# Patient Record
Sex: Male | Born: 1970 | Race: White | Hispanic: No | Marital: Married | Smoking: Never smoker
Health system: Southern US, Community
[De-identification: ages and names within clinical notes are randomized; demographics above are authoritative.]

## PROBLEM LIST (undated history)

## (undated) DIAGNOSIS — F419 Anxiety disorder, unspecified: Secondary | ICD-10-CM

## (undated) DIAGNOSIS — T7840XA Allergy, unspecified, initial encounter: Secondary | ICD-10-CM

## (undated) DIAGNOSIS — M1990 Unspecified osteoarthritis, unspecified site: Secondary | ICD-10-CM

## (undated) DIAGNOSIS — F32A Depression, unspecified: Secondary | ICD-10-CM

## (undated) HISTORY — PX: COLONOSCOPY W/ POLYPECTOMY: SHX1380

## (undated) HISTORY — DX: Allergy, unspecified, initial encounter: T78.40XA

## (undated) HISTORY — DX: Unspecified osteoarthritis, unspecified site: M19.90

## (undated) HISTORY — PX: COLONOSCOPY: SHX174

## (undated) HISTORY — PX: POLYPECTOMY: SHX149

## (undated) HISTORY — PX: WISDOM TOOTH EXTRACTION: SHX21

## (undated) HISTORY — DX: Depression, unspecified: F32.A

## (undated) HISTORY — DX: Anxiety disorder, unspecified: F41.9

---
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
## 2003-02-13 ENCOUNTER — Emergency Department (HOSPITAL_COMMUNITY): Admission: EM | Admit: 2003-02-13 | Discharge: 2003-02-13 | Payer: Self-pay | Admitting: Emergency Medicine

## 2004-12-21 ENCOUNTER — Encounter: Admission: RE | Admit: 2004-12-21 | Discharge: 2004-12-21 | Payer: Self-pay | Admitting: Internal Medicine

## 2009-12-25 ENCOUNTER — Emergency Department (HOSPITAL_COMMUNITY): Admission: EM | Admit: 2009-12-25 | Discharge: 2009-12-26 | Payer: Self-pay | Admitting: Emergency Medicine

## 2010-05-02 LAB — DIFFERENTIAL
Basophils Absolute: 0.1 10*3/uL (ref 0.0–0.1)
Basophils Relative: 0 % (ref 0–1)
Eosinophils Absolute: 0 10*3/uL (ref 0.0–0.7)
Eosinophils Relative: 0 % (ref 0–5)
Lymphocytes Relative: 4 % — ABNORMAL LOW (ref 12–46)
Lymphs Abs: 1 10*3/uL (ref 0.7–4.0)
Monocytes Absolute: 1.4 10*3/uL — ABNORMAL HIGH (ref 0.1–1.0)
Monocytes Relative: 6 % (ref 3–12)
Neutro Abs: 20.1 10*3/uL — ABNORMAL HIGH (ref 1.7–7.7)
Neutrophils Relative %: 89 % — ABNORMAL HIGH (ref 43–77)

## 2010-05-02 LAB — URINE CULTURE
Colony Count: NO GROWTH
Culture  Setup Time: 201111071019
Culture: NO GROWTH

## 2010-05-02 LAB — URINALYSIS, ROUTINE W REFLEX MICROSCOPIC
Bilirubin Urine: NEGATIVE
Glucose, UA: NEGATIVE mg/dL
Ketones, ur: NEGATIVE mg/dL
Leukocytes, UA: NEGATIVE
Nitrite: NEGATIVE
Protein, ur: NEGATIVE mg/dL
Specific Gravity, Urine: 1.015 (ref 1.005–1.030)
Urobilinogen, UA: 0.2 mg/dL (ref 0.0–1.0)
pH: 6 (ref 5.0–8.0)

## 2010-05-02 LAB — POCT I-STAT, CHEM 8
BUN: 9 mg/dL (ref 6–23)
Calcium, Ion: 1.16 mmol/L (ref 1.12–1.32)
Chloride: 102 mEq/L (ref 96–112)
Creatinine, Ser: 1.3 mg/dL (ref 0.4–1.5)
Glucose, Bld: 128 mg/dL — ABNORMAL HIGH (ref 70–99)
HCT: 45 % (ref 39.0–52.0)
Hemoglobin: 15.3 g/dL (ref 13.0–17.0)
Potassium: 3.5 mEq/L (ref 3.5–5.1)
Sodium: 138 mEq/L (ref 135–145)
TCO2: 25 mmol/L (ref 0–100)

## 2010-05-02 LAB — URINE MICROSCOPIC-ADD ON

## 2010-05-02 LAB — CBC
HCT: 42.1 % (ref 39.0–52.0)
Hemoglobin: 14.4 g/dL (ref 13.0–17.0)
MCH: 31.3 pg (ref 26.0–34.0)
MCHC: 34.3 g/dL (ref 30.0–36.0)
MCV: 91.4 fL (ref 78.0–100.0)
Platelets: 363 10*3/uL (ref 150–400)
RBC: 4.61 MIL/uL (ref 4.22–5.81)
RDW: 13.3 % (ref 11.5–15.5)
WBC: 22.5 10*3/uL — ABNORMAL HIGH (ref 4.0–10.5)

## 2010-05-02 LAB — HEMOCCULT GUIAC POC 1CARD (OFFICE): Fecal Occult Bld: NEGATIVE

## 2011-03-06 ENCOUNTER — Encounter: Payer: Self-pay | Admitting: Gastroenterology

## 2011-03-30 ENCOUNTER — Ambulatory Visit (AMBULATORY_SURGERY_CENTER): Payer: 59 | Admitting: *Deleted

## 2011-03-30 VITALS — Ht 74.0 in | Wt 220.0 lb

## 2011-03-30 DIAGNOSIS — Z1211 Encounter for screening for malignant neoplasm of colon: Secondary | ICD-10-CM

## 2011-03-30 DIAGNOSIS — Z800 Family history of malignant neoplasm of digestive organs: Secondary | ICD-10-CM

## 2011-03-30 MED ORDER — PEG-KCL-NACL-NASULF-NA ASC-C 100 G PO SOLR: ORAL | Status: DC

## 2011-04-02 ENCOUNTER — Encounter: Payer: Self-pay | Admitting: Internal Medicine

## 2011-04-11 ENCOUNTER — Encounter: Payer: 59 | Admitting: Internal Medicine

## 2011-04-13 ENCOUNTER — Other Ambulatory Visit: Payer: Self-pay | Admitting: Gastroenterology

## 2011-04-16 ENCOUNTER — Encounter: Payer: 59 | Admitting: Internal Medicine

## 2011-09-05 ENCOUNTER — Ambulatory Visit (INDEPENDENT_AMBULATORY_CARE_PROVIDER_SITE_OTHER): Payer: BC Managed Care – PPO | Admitting: Family Medicine

## 2011-09-05 VITALS — BP 130/80 | HR 71 | Temp 98.9°F | Resp 16 | Ht 74.0 in | Wt 215.0 lb

## 2011-09-05 DIAGNOSIS — S139XXA Sprain of joints and ligaments of unspecified parts of neck, initial encounter: Secondary | ICD-10-CM

## 2011-09-05 MED ORDER — KETOROLAC TROMETHAMINE 60 MG/2ML IM SOLN
60.0000 mg | Freq: Once | INTRAMUSCULAR | Status: AC
  Administered 2011-09-05: 60 mg via INTRAMUSCULAR

## 2011-09-05 MED ORDER — CYCLOBENZAPRINE HCL 5 MG PO TABS: 5.0000 mg | ORAL_TABLET | Freq: Three times a day (TID) | ORAL | Status: AC | PRN

## 2011-09-05 NOTE — Progress Notes (Signed)
  Subjective:    Patient ID: Jesus Howe, male    DOB: 1970/08/14, 41 y.o.   MRN: [id number]  HPI Neck pain x 2 days.  Initially noticed pain yesterday morning. Was fairly mild.  Pain acutely worsened since this am.  No strenuous activity.  Pain predominantly on R side of neck.  Pain radiates down neck to shoulder.  No radicular sxs.  No distal arm weakness or numbness.  Pain worse with neck flexion and extension.     Review of Systems See HPI, otherwise ROS negative.     Objective:   Physical Exam  Neck:     Gen: up in chair, NAD HEENT: NCAT, EOMI, TMs clear bilaterally CV: RRR, no murmurs auscultated PULM: CTAB, no wheezes, rales, rhoncii ABD: S/NT/+ bowel sounds  EXT: 2+ peripheral pulses    Assessment & Plan:  Cervical strain: RICE and NSAIDs.  Toradol 60mg  IM x 1.  Flexeril for pain at night.  Handout given.  Follow up if pain not improved.     The patient and/or caregiver has been counseled thoroughly with regard to treatment plan and/or medications prescribed including dosage, schedule, interactions, rationale for use, and possible side effects and they verbalize understanding. Diagnoses and expected course of recovery discussed and will return if not improved as expected or if the condition worsens. Patient and/or caregiver verbalized understanding.

## 2011-09-05 NOTE — Patient Instructions (Signed)

## 2012-05-20 ENCOUNTER — Encounter: Payer: Self-pay | Admitting: Gastroenterology

## 2012-05-20 ENCOUNTER — Ambulatory Visit (AMBULATORY_SURGERY_CENTER): Payer: BC Managed Care – PPO

## 2012-05-20 VITALS — Ht 74.0 in | Wt 210.6 lb

## 2012-05-20 DIAGNOSIS — Z800 Family history of malignant neoplasm of digestive organs: Secondary | ICD-10-CM

## 2012-05-20 DIAGNOSIS — Z1211 Encounter for screening for malignant neoplasm of colon: Secondary | ICD-10-CM

## 2012-05-20 MED ORDER — MOVIPREP 100 G PO SOLR: ORAL | Status: DC

## 2012-06-02 ENCOUNTER — Encounter: Payer: Self-pay | Admitting: Gastroenterology

## 2012-06-02 ENCOUNTER — Ambulatory Visit (AMBULATORY_SURGERY_CENTER): Payer: BC Managed Care – PPO | Admitting: Gastroenterology

## 2012-06-02 VITALS — BP 114/57 | HR 45 | Temp 98.3°F | Resp 13 | Ht 74.0 in | Wt 210.0 lb

## 2012-06-02 DIAGNOSIS — D126 Benign neoplasm of colon, unspecified: Secondary | ICD-10-CM

## 2012-06-02 DIAGNOSIS — Z800 Family history of malignant neoplasm of digestive organs: Secondary | ICD-10-CM

## 2012-06-02 DIAGNOSIS — Z1211 Encounter for screening for malignant neoplasm of colon: Secondary | ICD-10-CM

## 2012-06-02 MED ORDER — SODIUM CHLORIDE 0.9 % IV SOLN: 500.0000 mL | INTRAVENOUS | Status: DC

## 2012-06-02 NOTE — Patient Instructions (Addendum)
YOU HAD AN ENDOSCOPIC PROCEDURE TODAY AT THE [facility]: Refer to the procedure report that was given to you for any specific questions about what was found during the examination.  If the procedure report does not answer your questions, please call your gastroenterologist to clarify.  If you requested that your care partner not be given the details of your procedure findings, then the procedure report has been included in a sealed envelope for you to review at your convenience later.  YOU SHOULD EXPECT: Some feelings of bloating in the abdomen. Passage of more gas than usual.  Walking can help get rid of the air that was put into your GI tract during the procedure and reduce the bloating. If you had a lower endoscopy (such as a colonoscopy or flexible sigmoidoscopy) you may notice spotting of blood in your stool or on the toilet paper. If you underwent a bowel prep for your procedure, then you may not have a normal bowel movement for a few days.  DIET: Your first meal following the procedure should be a light meal and then it is ok to progress to your normal diet.  A half-sandwich or bowl of soup is an example of a good first meal.  Heavy or fried foods are harder to digest and may make you feel nauseous or bloated.  Likewise meals heavy in dairy and vegetables can cause extra gas to form and this can also increase the bloating.  Drink plenty of fluids but you should avoid alcoholic beverages for 24 hours.  ACTIVITY: Your care partner should take you home directly after the procedure.  You should plan to take it easy, moving slowly for the rest of the day.  You can resume normal activity the day after the procedure however you should NOT DRIVE or use heavy machinery for 24 hours (because of the sedation medicines used during the test).    SYMPTOMS TO REPORT IMMEDIATELY: A gastroenterologist can be reached at any hour.  During normal business hours, 8:30 AM to 5:00 PM Monday through Friday,  call [phone].  After hours and on weekends, please call the GI answering service at [phone] who will take a message and have the physician on call contact you.   Following lower endoscopy (colonoscopy or flexible sigmoidoscopy):  Excessive amounts of blood in the stool  Significant tenderness or worsening of abdominal pains  Swelling of the abdomen that is new, acute  Fever of 100F or higher    FOLLOW UP: If any biopsies were taken you will be contacted by phone or by letter within the next 1-3 weeks.  Call your gastroenterologist if you have not heard about the biopsies in 3 weeks.  Our staff will call the home number listed on your records the next business day following your procedure to check on you and address any questions or concerns that you may have at that time regarding the information given to you following your procedure. This is a courtesy call and so if there is no answer at the home number and we have not heard from you through the emergency physician on call, we will assume that you have returned to your regular daily activities without incident.  SIGNATURES/CONFIDENTIALITY: You and/or your care partner have signed paperwork which will be entered into your electronic medical record.  These signatures attest to the fact that that the information above on your After Visit Summary has been reviewed and is understood.  Full responsibility of the confidentiality   of this discharge information lies with you and/or your care-partner.     

## 2012-06-02 NOTE — Progress Notes (Signed)
Patient did not have preoperative order for IV antibiotic SSI prophylaxis. (G8918)  Patient did not experience any of the following events: a burn prior to discharge; a fall within the facility; wrong site/side/patient/procedure/implant event; or a hospital transfer or hospital admission upon discharge from the facility. (G8907)  

## 2012-06-02 NOTE — Progress Notes (Signed)
Called to room to assist during endoscopic procedure.  Patient ID and intended procedure confirmed with present staff. Received instructions for my participation in the procedure from the performing physician.  

## 2012-06-02 NOTE — Op Note (Signed)
[facility] [address] [place], [address]   COLONOSCOPY PROCEDURE REPORT  PATIENT: Jesus, Howe.  MR#: [id number] BIRTHDATE: 1970/06/24 , 42  yrs. old GENDER: Male ENDOSCOPIST: Mardella Layman, MD, Clementeen Graham REFERRED BY:  Creola Corn, M.D. PROCEDURE DATE:  06/02/2012 PROCEDURE:   Colonoscopy with snare polypectomy ASA CLASS:   Class I INDICATIONS:Average risk patient for colon cancer. MEDICATIONS: propofol (Diprivan) 300mg  IV  DESCRIPTION OF PROCEDURE:   After the risks and benefits and of the procedure were explained, informed consent was obtained.  A digital rectal exam revealed no abnormalities of the rectum.    The LB CF-[id number]  endoscope was introduced through the anus and advanced to the cecum, which was identified by both the appendix and ileocecal valve .  The quality of the prep was excellent, using MoviPrep .  The instrument was then slowly withdrawn as the colon was fully examined.     COLON FINDINGS: A sessile polyp was found.   The colon was otherwise normal.  There was no diverticulosis, inflammation, polyps or cancers unless previously stated. in the cecum and cold snare excised.    Retroflexed views revealed no abnormalities.     The scope was then withdrawn from the patient and the procedure completed.  COMPLICATIONS: There were no complications. ENDOSCOPIC IMPRESSION: 1.   Sessile polyp was found .Marland Kitchen?? serrated adenoma in a patient with ++ FH of colon cancer. 2.   The colon was otherwise normal  RECOMMENDATIONS: Repeat Colonoscopy in 3 years.   REPEAT EXAM:  cc:  _______________________________ eSignedMardella Layman, MD, [facility] 06/02/2012 9:18 AM     PATIENT NAME:  Jesus Howe MR#: [id number]

## 2012-06-03 ENCOUNTER — Telehealth: Payer: Self-pay | Admitting: *Deleted

## 2012-06-03 NOTE — Telephone Encounter (Signed)
  Follow up Call-  Call back number 06/02/2012  Post procedure Call Back phone  # [phone]  Permission to leave phone message Yes     Patient questions:  Do you have a fever, pain , or abdominal swelling? no Pain Score  0 *  Have you tolerated food without any problems? yes  Have you been able to return to your normal activities? yes  Do you have any questions about your discharge instructions: Diet   no Medications  no Follow up visit  no  Do you have questions or concerns about your Care? no  Actions: * If pain score is 4 or above: No action needed, pain <4.

## 2012-06-09 ENCOUNTER — Encounter: Payer: Self-pay | Admitting: Gastroenterology

## 2012-06-09 ENCOUNTER — Encounter: Payer: Self-pay | Admitting: *Deleted

## 2012-10-21 ENCOUNTER — Emergency Department (HOSPITAL_COMMUNITY)
Admission: EM | Admit: 2012-10-21 | Discharge: 2012-10-21 | Disposition: A | Discharge status: Home / Self Care | Payer: BC Managed Care – PPO | Attending: Pediatric Emergency Medicine | Admitting: Pediatric Emergency Medicine

## 2012-10-21 ENCOUNTER — Encounter (HOSPITAL_COMMUNITY): Payer: Self-pay | Admitting: *Deleted

## 2012-10-21 DIAGNOSIS — R6889 Other general symptoms and signs: Secondary | ICD-10-CM | POA: Insufficient documentation

## 2012-10-21 DIAGNOSIS — Z79899 Other long term (current) drug therapy: Secondary | ICD-10-CM | POA: Insufficient documentation

## 2012-10-21 DIAGNOSIS — Z23 Encounter for immunization: Secondary | ICD-10-CM | POA: Insufficient documentation

## 2012-10-21 DIAGNOSIS — Z203 Contact with and (suspected) exposure to rabies: Secondary | ICD-10-CM

## 2012-10-21 MED ORDER — RABIES VACCINE, PCEC IM SUSR
1.0000 mL | Freq: Once | INTRAMUSCULAR | Status: AC
  Administered 2012-10-21: 1 mL via INTRAMUSCULAR
  Filled 2012-10-21: qty 1

## 2012-10-21 MED ORDER — RABIES IMMUNE GLOBULIN 150 UNIT/ML IM INJ
20.0000 [IU]/kg | INJECTION | Freq: Once | INTRAMUSCULAR | Status: AC
  Administered 2012-10-21: 1950 [IU] via INTRAMUSCULAR
  Filled 2012-10-21: qty 13

## 2012-10-21 NOTE — ED Notes (Signed)
Pt. Repots possible bat exposure due to bat being in the house last night.

## 2012-10-21 NOTE — ED Provider Notes (Signed)
CSN: [id number]     Arrival date & time 10/21/12  1418 History   First MD Initiated Contact with Patient 10/21/12 1424     Chief Complaint  Patient presents with  . Broadus John    (Consider location/radiation/quality/duration/timing/severity/associated sxs/prior Treatment) HPI Comments: Bat in house last night.  No known contact with bat. No fever or other symptoms at this time.    Patient is a 42 y.o. male presenting with general illness. The history is provided by the patient.  Illness Severity:  Mild Onset quality:  Sudden Duration:  12 hours Timing:  Constant Progression:  Unchanged Chronicity:  New Associated symptoms: no fever     Past Medical History  Diagnosis Date  . Allergy    Past Surgical History  Procedure Laterality Date  . Anterior cruciate ligament repair  1989    RIGHT  . Wisdom tooth extraction     Family History  Problem Relation Age of Onset  . Colon cancer Mother 73    X2  . Breast cancer Mother    History  Substance Use Topics  . Smoking status: Never Smoker   . Smokeless tobacco: Never Used  . Alcohol Use: 4.4 oz/week    4 Glasses of wine, 4 Drinks containing 0.5 oz of alcohol per week    Review of Systems  Constitutional: Negative for fever.  All other systems reviewed and are negative.    Allergies  Review of patient's allergies indicates no known allergies.  Home Medications   Current Outpatient Rx  Name  Route  Sig  Dispense  Refill  . finasteride (PROPECIA) 1 MG tablet   Oral   Take 1 mg by mouth daily.         . Naproxen Sodium (ALEVE PO)   Oral   Take 2 tablets by mouth daily as needed (pain).          BP 116/76  Pulse 72  Temp(Src) 98.4 F (36.9 C) (Oral)  Resp 18  Wt 211 lb 8 oz (95.936 kg)  BMI 27.14 kg/m2  SpO2 97% Physical Exam  Nursing note and vitals reviewed. Constitutional: He appears well-developed and well-nourished.  HENT:  Head: Normocephalic and atraumatic.  Eyes: Conjunctivae are normal.   Neck: Neck supple.  Cardiovascular: Normal rate and regular rhythm.   Pulmonary/Chest: Effort normal.  Abdominal: Soft.  Musculoskeletal: Normal range of motion.  Neurological: He is alert.  Skin: Skin is warm and dry.    ED Course  Procedures (including critical care time) Labs Review Labs Reviewed - No data to display Imaging Review No results found.  MDM   1. Rabies exposure    42 y.o. with bat exposure last night.  Rabies vaccine and immunoglobulin   Ermalinda Memos, MD 10/21/12 [phone]

## 2012-10-24 ENCOUNTER — Encounter (HOSPITAL_COMMUNITY): Payer: Self-pay | Admitting: Emergency Medicine

## 2012-10-24 ENCOUNTER — Emergency Department (HOSPITAL_COMMUNITY)
Admission: EM | Admit: 2012-10-24 | Discharge: 2012-10-24 | Disposition: A | Discharge status: Home / Self Care | Payer: BC Managed Care – PPO | Source: Home / Self Care

## 2012-10-24 DIAGNOSIS — Z203 Contact with and (suspected) exposure to rabies: Secondary | ICD-10-CM

## 2012-10-24 MED ORDER — RABIES VACCINE, PCEC IM SUSR
INTRAMUSCULAR | Status: AC
  Filled 2012-10-24: qty 1

## 2012-10-24 MED ORDER — RABIES VACCINE, PCEC IM SUSR
1.0000 mL | Freq: Once | INTRAMUSCULAR | Status: AC
  Administered 2012-10-24: 1 mL via INTRAMUSCULAR

## 2012-10-24 NOTE — ED Notes (Signed)
Here for rabies injection only. Pt voices no concerns at this time.  

## 2012-10-29 ENCOUNTER — Emergency Department (HOSPITAL_COMMUNITY)
Admission: EM | Admit: 2012-10-29 | Discharge: 2012-10-29 | Disposition: A | Discharge status: Home / Self Care | Payer: BC Managed Care – PPO | Source: Home / Self Care

## 2012-10-29 ENCOUNTER — Encounter (HOSPITAL_COMMUNITY): Payer: Self-pay | Admitting: Emergency Medicine

## 2012-10-29 DIAGNOSIS — Z203 Contact with and (suspected) exposure to rabies: Secondary | ICD-10-CM

## 2012-10-29 MED ORDER — RABIES VACCINE, PCEC IM SUSR
1.0000 mL | Freq: Once | INTRAMUSCULAR | Status: AC
  Administered 2012-10-29: 1 mL via INTRAMUSCULAR

## 2012-10-29 MED ORDER — RABIES VACCINE, PCEC IM SUSR
INTRAMUSCULAR | Status: AC
  Filled 2012-10-29: qty 1

## 2012-10-29 NOTE — ED Notes (Signed)
Patient is here for Day 7 vaccination

## 2012-11-05 ENCOUNTER — Encounter (HOSPITAL_COMMUNITY): Payer: Self-pay | Admitting: Emergency Medicine

## 2012-11-05 ENCOUNTER — Emergency Department (HOSPITAL_COMMUNITY)
Admission: EM | Admit: 2012-11-05 | Discharge: 2012-11-05 | Disposition: A | Discharge status: Home / Self Care | Payer: BC Managed Care – PPO | Source: Home / Self Care

## 2012-11-05 DIAGNOSIS — Z203 Contact with and (suspected) exposure to rabies: Secondary | ICD-10-CM

## 2012-11-05 MED ORDER — RABIES VACCINE, PCEC IM SUSR
1.0000 mL | Freq: Once | INTRAMUSCULAR | Status: AC
  Administered 2012-11-05: 1 mL via INTRAMUSCULAR

## 2012-11-05 MED ORDER — RABIES VACCINE, PCEC IM SUSR
INTRAMUSCULAR | Status: AC
  Filled 2012-11-05: qty 1

## 2012-11-05 NOTE — ED Notes (Signed)
Pt is here for his 4th rabies vaccination Voices no new concerns... He is alert w/no signs of acute distress.

## 2014-07-21 ENCOUNTER — Ambulatory Visit (INDEPENDENT_AMBULATORY_CARE_PROVIDER_SITE_OTHER): Payer: BLUE CROSS/BLUE SHIELD

## 2014-07-21 ENCOUNTER — Ambulatory Visit (INDEPENDENT_AMBULATORY_CARE_PROVIDER_SITE_OTHER): Payer: BLUE CROSS/BLUE SHIELD | Admitting: Family Medicine

## 2014-07-21 VITALS — BP 110/74 | HR 52 | Temp 98.2°F | Resp 16 | Ht 74.0 in | Wt 208.0 lb

## 2014-07-21 DIAGNOSIS — S9001XA Contusion of right ankle, initial encounter: Secondary | ICD-10-CM

## 2014-07-21 DIAGNOSIS — S90511A Abrasion, right ankle, initial encounter: Secondary | ICD-10-CM | POA: Diagnosis not present

## 2014-07-21 DIAGNOSIS — M25571 Pain in right ankle and joints of right foot: Secondary | ICD-10-CM

## 2014-07-21 DIAGNOSIS — Z23 Encounter for immunization: Secondary | ICD-10-CM | POA: Diagnosis not present

## 2014-07-21 NOTE — Progress Notes (Signed)
[facility] [address], [place] [address] [phone]  Date:  07/21/2014   Name:  Jesus Howe   DOB:  [birth year]   MRN:  [id number]  PCP:  Precious Reel, MD    Chief Complaint: Ankle Pain   History of Present Illness:  Jesus Howe is a 44 y.o. very pleasant male patient who presents with the following:  Here today with a right ankle injury- he was working up a ladder, something fell and hit his lateral ankle.  The ankle "blew up" right away," he developed bruise and got a small laceration on the ankle.  He does not have a lot of pain with walking, but does not have rull ROM He did not twist the ankle.    He is not aware of the date of his last tetanus shot.    There are no active problems to display for this patient.   Past Medical History  Diagnosis Date  . Allergy     Past Surgical History  Procedure Laterality Date  . Anterior cruciate ligament repair  1989    RIGHT  . Wisdom tooth extraction      History  Substance Use Topics  . Smoking status: Never Smoker   . Smokeless tobacco: Never Used  . Alcohol Use: 4.4 oz/week    4 Glasses of wine, 4 Standard drinks or equivalent per week    Family History  Problem Relation Age of Onset  . Colon cancer Mother 62    X2  . Breast cancer Mother     No Known Allergies  Medication list has been reviewed and updated.  Current Outpatient Prescriptions on File Prior to Visit  Medication Sig Dispense Refill  . finasteride (PROPECIA) 1 MG tablet Take 1 mg by mouth daily.    . Naproxen Sodium (ALEVE PO) Take 2 tablets by mouth daily as needed (pain).     No current facility-administered medications on file prior to visit.    Review of Systems:  As per HPI- otherwise negative.   Physical Examination: Filed Vitals:   07/21/14 0814  BP: 110/74  Pulse: 52  Temp: 98.2 F (36.8 C)  Resp: 16   Filed Vitals:   07/21/14 0814  Height: 6\' 2"  (1.88 m)  Weight: 208 lb (94.348 kg)    Body mass index is 26.69 kg/(m^2). Ideal Body Weight: Weight in (lb) to have BMI = 25: 194.3  GEN: WDWN, NAD, Non-toxic, A & O x 3, looks well HEENT: Atraumatic, Normocephalic. Neck supple. No masses, No LAD. Ears and Nose: No external deformity. CV: RRR, No M/G/R. No JVD. No thrill. No extra heart sounds. PULM: CTA B, no wheezes, crackles, rhonchi. No retractions. No resp. distress. No accessory muscle use. EXTR: No c/c/e NEURO Normal gait. Does not favor ankle PSYCH: Normally interactive. Conversant. Not depressed or anxious appearing.  Calm demeanor.  Right ankle: small abrasion over the lateral malleolus, and bruise under the malleolus.  Achilles is normal and intact.  He is tender over the abrasion, OW non- tender  [facility] reading (PRIMARY) by  Dr. Lorelei Pont. Right ankle: negative  Assessment and Plan: Contusion of right ankle, initial encounter - Plan: DG Ankle Complete Right  Pain in right ankle  Abrasion of right ankle, initial encounter - Plan: Tdap vaccine greater than or equal to 7yo IM  See pt instructions Tetanus updated today  Signed Lamar Blinks, MD

## 2014-07-21 NOTE — Patient Instructions (Signed)
I do not see any sign of an ankle fracture- it seems you have a bad bruise.Watch for any sign of infection such as redness, swelling or heat.  Let me know if your ankle is not feeling better over the next week or so  Otherwise I think your ankle will do well- ice and elevate as needed, and you can certainly use an ace wrap if you like.    You got a tetanus booster today as well

## 2015-05-31 ENCOUNTER — Encounter: Payer: Self-pay | Admitting: Gastroenterology

## 2015-07-07 ENCOUNTER — Encounter: Payer: Self-pay | Admitting: Gastroenterology

## 2015-08-15 ENCOUNTER — Ambulatory Visit (AMBULATORY_SURGERY_CENTER): Payer: Self-pay

## 2015-08-15 VITALS — Ht 74.0 in | Wt 216.0 lb

## 2015-08-15 DIAGNOSIS — Z860100 Personal history of colon polyps, unspecified: Secondary | ICD-10-CM

## 2015-08-15 MED ORDER — SUPREP BOWEL PREP KIT 17.5-3.13-1.6 GM/177ML PO SOLN: 1.0000 | Freq: Once | ORAL | Status: DC

## 2015-08-15 NOTE — Progress Notes (Signed)
No allergies to eggs or soy No past problems with anesthesia No diet meds No home oxygen  Has email and internet; declined emmi 

## 2015-08-17 ENCOUNTER — Encounter: Payer: Self-pay | Admitting: Gastroenterology

## 2015-08-26 ENCOUNTER — Encounter: Payer: Self-pay | Admitting: Gastroenterology

## 2015-08-26 ENCOUNTER — Ambulatory Visit (AMBULATORY_SURGERY_CENTER): Payer: BLUE CROSS/BLUE SHIELD | Admitting: Gastroenterology

## 2015-08-26 VITALS — BP 105/60 | HR 54 | Temp 97.1°F | Resp 21 | Ht 74.0 in | Wt 216.0 lb

## 2015-08-26 DIAGNOSIS — Z800 Family history of malignant neoplasm of digestive organs: Secondary | ICD-10-CM

## 2015-08-26 DIAGNOSIS — Z86010 Personal history of colonic polyps: Secondary | ICD-10-CM | POA: Diagnosis not present

## 2015-08-26 MED ORDER — SODIUM CHLORIDE 0.9 % IV SOLN: 500.0000 mL | INTRAVENOUS | Status: DC

## 2015-08-26 NOTE — Patient Instructions (Signed)

## 2015-08-26 NOTE — Op Note (Signed)
Jesus Howe Procedure Date: 08/26/2015 10:43 AM MRN: [id number] Endoscopist: Mallie Mussel Jesus Howe , MD Age: 45 Referring MD:  Date of Birth: 10/08/1970 Gender: Male Account #: [id number] Procedure:                Colonoscopy Indications:              High risk colon cancer surveillance: Personal                            history of sessile serrated colon polyp (less than                            10 mm in size) with no dysplasia, Family history of                            colon cancer in a first-degree relative (mother,                            age > 9) Medicines:                Monitored Anesthesia Care Procedure:                Pre-Anesthesia Assessment:                           - Prior to the procedure, a History and Physical                            was performed, and patient medications and                            allergies were reviewed. The patient's tolerance of                            previous anesthesia was also reviewed. The risks                            and benefits of the procedure and the sedation                            options and risks were discussed with the patient.                            All questions were answered, and informed consent                            was obtained. Prior Anticoagulants: The patient has                            taken no previous anticoagulant or antiplatelet                            agents. ASA Grade Assessment: I - A normal, healthy  patient. After reviewing the risks and benefits,                            the patient was deemed in satisfactory condition to                            undergo the procedure.                           After obtaining informed consent, the colonoscope                            was passed under direct vision. Throughout the                            procedure, the patient's blood pressure, pulse, and            oxygen saturations were monitored continuously. The                            Model CF-[id number]) scope was introduced                            through the anus and advanced to the the cecum,                            identified by appendiceal orifice and ileocecal                            valve. The colonoscopy was performed without                            difficulty. The patient tolerated the procedure                            well. The quality of the bowel preparation was                            excellent. The ileocecal valve, appendiceal                            orifice, and rectum were photographed. The bowel                            preparation used was Miralax. Scope In: 10:53:35 AM Scope Out: 11:05:28 AM Scope Withdrawal Time: 0 hours 9 minutes 30 seconds  Total Procedure Duration: 0 hours 11 minutes 53 seconds  Findings:                 The perianal and digital rectal examinations were                            normal.                           The entire examined colon appeared  normal on direct                            and retroflexion views. Complications:            No immediate complications. Estimated Blood Loss:     Estimated blood loss: none. Impression:               - The entire examined colon is normal on direct and                            retroflexion views.                           - No specimens collected. Recommendation:           - Patient has a contact number available for                            emergencies. The signs and symptoms of potential                            delayed complications were discussed with the                            patient. Return to normal activities tomorrow.                            Written discharge instructions were provided to the                            patient.                           - Resume previous diet.                           - Continue present medications.                            - Repeat colonoscopy in 5 years for screening                            purposes. Tim Corriher L. [name], MD 08/26/2015 11:09:03 AM This report has been signed electronically.

## 2015-08-26 NOTE — Progress Notes (Signed)
A/ox3 pleased with MAC, report to April RN 

## 2015-08-29 ENCOUNTER — Telehealth: Payer: Self-pay

## 2015-08-29 NOTE — Telephone Encounter (Signed)
  Follow up Call-  Call back number 08/26/2015  Post procedure Call Back phone  # [phone]  Permission to leave phone message Yes     Patient questions:  Do you have a fever, pain , or abdominal swelling? No. Pain Score  0 *  Have you tolerated food without any problems? Yes.    Have you been able to return to your normal activities? Yes.    Do you have any questions about your discharge instructions: Diet   No. Medications  No. Follow up visit  No.  Do you have questions or concerns about your Care? No.  Actions: * If pain score is 4 or above: No action needed, pain <4.

## 2015-09-02 ENCOUNTER — Telehealth: Payer: Self-pay | Admitting: *Deleted

## 2015-09-02 NOTE — Telephone Encounter (Signed)
Phone call opened in error.

## 2016-09-11 ENCOUNTER — Ambulatory Visit (INDEPENDENT_AMBULATORY_CARE_PROVIDER_SITE_OTHER): Payer: BLUE CROSS/BLUE SHIELD

## 2016-09-11 ENCOUNTER — Encounter: Payer: Self-pay | Admitting: Physician Assistant

## 2016-09-11 ENCOUNTER — Ambulatory Visit (INDEPENDENT_AMBULATORY_CARE_PROVIDER_SITE_OTHER): Payer: BLUE CROSS/BLUE SHIELD | Admitting: Physician Assistant

## 2016-09-11 VITALS — BP 123/82 | HR 63 | Temp 98.8°F | Resp 17 | Ht 75.5 in | Wt 216.0 lb

## 2016-09-11 DIAGNOSIS — M79674 Pain in right toe(s): Secondary | ICD-10-CM

## 2016-09-11 NOTE — Progress Notes (Signed)
   Jesus Howe  MRN: [id number] DOB: 21-Jul-1970  PCP: Jesus Baton, MD  Subjective:  Pt is a pleasant 46 year old male who presents to clinic for toe pain. He dropped his backpack on his toe 3 days ago. Pain is at the main joint of great toe. Hurts worse with bending toe, standing on tip toe. Notes his pain has been increasing over the past few days. Denies bruising, swelling, abnormal sensation, n/t, deformity, increased cold/warmth of toe. He is taking ibuprofen for pain.   Review of Systems  Musculoskeletal: Positive for arthralgias and gait problem. Negative for joint swelling.  Skin: Negative for color change.    There are no active problems to display for this patient.   No current outpatient prescriptions on file prior to visit.   No current facility-administered medications on file prior to visit.     No Known Allergies   Objective:  BP 123/82   Pulse 63   Temp 98.8 F (37.1 C) (Oral)   Resp 17   Ht 6' 3.5" (1.918 m)   Wt 216 lb (98 kg)   SpO2 98%   BMI 26.64 kg/m   Physical Exam  Constitutional: He is oriented to person, place, and time and well-developed, well-nourished, and in no distress. No distress.  Musculoskeletal:       Left foot: There is bony tenderness. There is normal range of motion, no tenderness, no swelling, normal capillary refill, no crepitus, no deformity and no laceration.       Feet:  Neurological: He is alert and oriented to person, place, and time. GCS score is 15.  Skin: Skin is warm and dry.  Psychiatric: Mood, memory, affect and judgment normal.  Vitals reviewed.   Dg Toe Great Right  Result Date: 09/11/2016 CLINICAL DATA:  46 year old male with right first toe pain after dropping heavy object on toe. Initial encounter. EXAM: RIGHT GREAT TOE COMPARISON:  None. FINDINGS: No fracture or dislocation. Well corticated ossific structure base of the right first distal phalanx felt to be unrelated to recent trauma and may represent  accessory bone. IMPRESSION: No fracture or dislocation. Electronically Signed   By: Genia Del M.D.   On: 09/11/2016 09:12    Assessment and Plan :  1. Pain of toe of right foot - DG Toe Great Right; Future - X ray is negative. Suspect toe pain due to possible periosteal hematoma. Discussed use of hard sole shoe x 2-3 weeks. He declines post op shoe today. Con't taking ibuprofen, apply ice to affected area. RTC in 3-4 weeks if no improvement. Consider ortho or physical therapy referral. He agrees with plan.    Mercer Pod, PA-C  [facility] 09/11/2016 8:45 AM

## 2016-09-11 NOTE — Patient Instructions (Addendum)
You have a bruised bone. Continue taking ibuprofen every 6 hours. Ice your toe 20 min a time 2-3 times a day, keep it elevated.  Wear hard sole shoe for the next 2-3 weeks.  Come back if you are not better in 3-4 weeks.   [facility] [address], [place] "post op shoe".  X-ray:  EXAM: RIGHT GREAT TOE COMPARISON:  None. FINDINGS: No fracture or dislocation. Well corticated ossific structure base of the right first distal phalanx felt to be unrelated to recent trauma and may represent accessory bone. IMPRESSION: No fracture or dislocation  Thank you for coming in today. I hope you feel we met your needs.  Feel free to call PCP if you have any questions or further requests.  Please consider signing up for MyChart if you do not already have it, as this is a great way to communicate with me.  Best,  Whitney McVey, PA-C   Periosteal Hematoma Periosteal hematoma is a bone bruise that can cause tenderness and swelling close to the bone. It can result from a small, hidden break (fracture) in the bone, other injury to the bone area, or surgery. It typically occurs in bones that are close to the surface of the skin, such as shin, knee, or heel bones. It may take several weeks to heal completely. What are the causes? This condition is usually caused by:  Bone injury (trauma), such as: ? A hard, direct hit (blow). ? A fracture. ? A sports injury, such as tearing the anterior cruciate ligament (ACL) in the knee.  Landing improperly after a jump.  Running too hard or too much without stretching.  Twisting injuries.  What are the signs or symptoms? Symptoms of this condition include:  Severe pain around the injured area that usually lasts longer than a normal bruise.  Difficulty using the bruised area.  Tender, swollen area close to the bone.  Discoloration of the bruised area.  How is this diagnosed? This condition may be diagnosed based on:  A physical  exam.  MRI.  X-rays.  How is this treated? Treatment for this condition depends on the severity of the injury. Your health care provider may recommend that you:  Rest and put ice on the injured area.  Take over-the-counter medicine for pain as needed.  Use crutches to help you walk, if needed.  Wear splints or braces around the injured area.  Do physical therapy.  Follow these instructions at home: If you have a plaster splint:  Wear the splint as told by your health care provider.  For the first 24 hours, rest the splint on a soft surface, such as a pillow.  Do not put weight on the splint.  Do not get the splint wet. Cover the splint with a watertight plastic covering when you take a bath or shower.  Do not stick anything inside the splint to scratch your skin. Doing that increases your risk of infection.  Check the skin around the splint every day. Tell your health care provider about any concerns.  You may put lotion on dry skin around the edges of the splint. Do not put lotion on the skin underneath the splint. If you have an air splint or a brace:  Wear it as told by your health care provider. You may change the amount of air in the air splint as needed for comfort.  You may remove your air splint or brace before bathing, before applying ice, and at night. Do  not let it get wet. Managing pain, stiffness, and swelling  If possible, raise (elevate) the injured area above the level of your heart while you are sitting or lying down.  Wiggle your toes often.  Use an elastic wrap as directed by your health care provider. Loosen the wrap if the area around the wrap tingles, becomes numb, or turns cold and blue.  If directed, put ice on the injured area: ? If you have a removable splint or brace, remove it as told by your health care provider. ? Put ice in a plastic bag. ? Place a towel between your skin and the bag or between your plaster splint and the bag. ? Leave  the ice on for 20 minutes, 2-3 times a day. Driving  Do not drive or use heavy machinery while taking prescription pain medicines.  Ask your health care provider when it is safe for you to drive if you have a splint on part of your arm or leg. Activity  Do not participate in any activities that involve the injured area until your health care provider approves or you are fully healed. Your health care provider may recommend low-impact activities, such as swimming or cycling, instead of your normal activities.  If physical therapy was prescribed, do exercises as told by your health care provider.  Follow instructions from your health care provider about whether walking with crutches or a cane is required. This depends on the severity and location of your injury. ? Use crutches or a cane until you can stand without pain, or for as long as told by your health care provider. Gradually, start using or bearing weight on the injured body part. General instructions  Take over-the-counter and prescription medicines only as told by your health care provider.  Keep all follow-up visits as told by your health care provider or physical therapist. This is important. Contact a health care provider if:  You have any of the following symptoms that get worse: ? Bruising. ? Swelling. ? Tenderness. ? Warmth in the injured area. ? Difficulty bearing weight on the injured body part.  Your toes feel unusually cold, especially if this does not improve after removing a splint or wrap.  You have pain that gets worse or does not improve with medicine.  You have problems with your splint, such as: ? Itching under your splint that will not go away. ? Red or irritated skin around the splint. ? Cracks or breaks in the splint. ? A feeling that the splint is too tight. Get help right away if:  You have severe pain near the injured area or severe pain with stretching.  You have increased swelling that results  in a tense, hard area or a loss of feeling in the area of the injury.  You have pale, cool skin below the area of the injury (in an arm or leg), and this does not go away after removing a splint or wrap.  You cannot move your toes or fingers, even after removing the splint or wrap. Summary  Periosteal hematoma is a bone bruise that can cause tenderness and swelling close to the bone.  Symptoms of this condition include pain that lasts longer than a normal bruise, difficulty using the bruised area, a tender and swollen area close to the bone, and discoloration near the bruised area.  You may be given a plaster splint, an air splint, or a brace, depending on the location and severity of your injury.  Treatment usually  includes resting and icing the injured area, physical therapy, and pain medicine as needed. This information is not intended to replace advice given to you by your health care provider. Make sure you discuss any questions you have with your health care provider. Document Released: 03/15/2004 Document Revised: 01/10/2016 Document Reviewed: 01/10/2016 Elsevier Interactive Patient Education  2017 [employer].   IF you received an x-ray today, you will receive an invoice from [facility]. Please contact [facility] at [phone] with questions or concerns regarding your invoice.   IF you received labwork today, you will receive an invoice from [place]. Please contact [facility] at [phone] with questions or concerns regarding your invoice.   Our billing staff will not be able to assist you with questions regarding bills from these companies.  You will be contacted with the lab results as soon as they are available. The fastest way to get your results is to activate your My Chart account. Instructions are located on the last page of this paperwork. If you have not heard from [place] regarding the results in 2 weeks, please contact this office.

## 2017-06-17 ENCOUNTER — Ambulatory Visit: Payer: BLUE CROSS/BLUE SHIELD | Admitting: Sports Medicine

## 2017-06-17 VITALS — BP 128/82 | Ht 75.0 in | Wt 210.0 lb

## 2017-06-17 DIAGNOSIS — M25562 Pain in left knee: Secondary | ICD-10-CM | POA: Diagnosis not present

## 2017-06-18 ENCOUNTER — Encounter: Payer: Self-pay | Admitting: Sports Medicine

## 2017-06-18 NOTE — Progress Notes (Signed)
   Subjective:    Patient ID: Jesus Howe, male    DOB: 03-May-1970, 47 y.o.   MRN: [id number]  HPI Chief complaint: Left knee pain  Very pleasant 47 year old male comes in complaining of 4 months of medial sided left knee pain. He denies any trauma but does describe a sudden onset of pain that began after he remodeled his basement. He was doing a lot of squatting and bending during that project. Shortly afterwards, he began to experience a sharp medial sided knee pain which is present intermittently. It is most noticeable when twisting or turning. He also endorses an underlying achiness in the knee which can be present simply at rest. He also describes stiffness with prolonged sitting. He denies locking but has noticed swelling.He was seen at [facility] X-rays were done and he was told he had "moderate arthritis". They suspected he had a meniscal tear and discussed MRI and surgery with him. He is here today for a second opinion. Ibuprofen has not been of much help. Ice has been somewhat helpful. He has noticed painful popping. No numbness or tingling. No prior knee surgeries on the left but he is status post right knee anterior cruciate ligament reconstruction done many years ago. He has done well postoperatively.  Past medical history reviewed Medications reviewed Allergies reviewed    Review of Systems    as above Objective:   Physical Exam  Well-developed, well-nourished. No acute distress. Awake alert and oriented 3. Vital signs reviewed  Left knee: Full range of motion. Trace effusion. He is tender to palpation along the medial joint line with a positive Thessaly's. Pain but no popping with McMurray's. No tenderness along the lateral joint line. Knee is stable to valgus and varus stressing. Negative anterior drawer, negative posterior drawer. Good strength. Neurovascularly intact distally.  Right knee: Full range of motion. No effusion. No tenderness to palpation.  Knee is stable to ligamentous exam including a negative anterior drawer. Neurovascularly intact distally.  MSK ultrasound of the left knee was performed. Limited images were obtained. Patient has a moderate-sized joint effusion and bulging of the medial meniscus from the medial joint line. Findings consistent with probable occult medial meniscal tear.      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 He will drop those off at the office at his convenience. I discussed various treatment options. We will start with a body helix compression sleeve which he will wear daily with activity. He will also start daily isometric quad exercises and follow-up with me in 3 weeks. Depending on how he responds to today's treatment we could discuss merits of cortisone injection or MRI. Patient will avoid impact type exercises until further notice. He is okay to bike or swim. Call with questions or concerns prior to follow-up.

## 2017-07-11 ENCOUNTER — Ambulatory Visit (INDEPENDENT_AMBULATORY_CARE_PROVIDER_SITE_OTHER): Payer: BLUE CROSS/BLUE SHIELD | Admitting: Sports Medicine

## 2017-07-11 VITALS — BP 118/86 | Ht 75.0 in | Wt 210.0 lb

## 2017-07-11 DIAGNOSIS — M2342 Loose body in knee, left knee: Secondary | ICD-10-CM

## 2017-07-12 NOTE — Progress Notes (Signed)
   Subjective:    Patient ID: Jesus Howe, male    DOB: August 26, 1970, 47 y.o.   MRN: [id number]  HPI   Patient comes in today for follow-up on left knee pain. Overall, he is doing better. Swelling and pain have both improved. He has been doing his home exercises and wearing his compression sleeve. I was able to review the x-rays of his left knee from [facility]. He has several loose bodies within the joint. Mild degenerative changes seen. Patient denies locking of the knee.   Review of Systems As above    Objective:   Physical Exam  Well-developed, well-nourished. No acute distress. Awake alert and oriented 3. Vital signs reviewed  Left knee: Full range of motion. Previous effusion has resolved.No tenderness to palpation. Negative McMurray's. Good stability. Good strength. Neurovascular intact distally. Walking without a limp.  Left knee x-rays from [facility] are as above      Assessment & Plan:   Left knee pain secondary to loose bodies Status post remote right knee anterior cruciate ligament reconstruction done by Dr. Noemi Chapel  I reviewed the patient's x-ray with him. He has several loose bodies within the left knee. Since his symptoms have improved, we will wait on further workup or treatment. He denies mechanical symptoms, specifically locking of the knee. He does understand that the loose bodies in his knee will remain unless arthroscopically removed. If he has returning pain or swelling or if his knee starts locking on him, patient is encouraged to follow-up with Dr. Noemi Chapel for knee arthroscopy.

## 2019-04-06 ENCOUNTER — Ambulatory Visit: Payer: BC Managed Care – PPO | Attending: Internal Medicine

## 2019-04-06 DIAGNOSIS — Z23 Encounter for immunization: Secondary | ICD-10-CM | POA: Insufficient documentation

## 2019-04-06 NOTE — Progress Notes (Signed)
   Covid-19 Vaccination Clinic  Name:  BEAR GALA    MRN: [id number] DOB: 1970/04/25  04/06/2019  Mr. Leiva was observed post Covid-19 immunization for 15 minutes without incidence. He was provided with Vaccine Information Sheet and instruction to access the V-Safe system.   Mr. Biggio was instructed to call 911 with any severe reactions post vaccine: Marland Kitchen Difficulty breathing  . Swelling of your face and throat  . A fast heartbeat  . A bad rash all over your body  . Dizziness and weakness    Immunizations Administered    Name Date Dose VIS Date Route   Pfizer COVID-19 Vaccine 04/06/2019  6:56 PM 0.3 mL 01/30/2019 Intramuscular   Manufacturer: [place]   Lot: [id number]   [place]: [id number]

## 2019-04-28 ENCOUNTER — Ambulatory Visit: Payer: BC Managed Care – PPO | Attending: Internal Medicine

## 2019-04-28 DIAGNOSIS — Z23 Encounter for immunization: Secondary | ICD-10-CM | POA: Insufficient documentation

## 2019-04-28 NOTE — Progress Notes (Signed)
   Covid-19 Vaccination Clinic  Name:  Jesus Howe    MRN: [id number] DOB: [birth year]  04/28/2019  [name] was observed post Covid-19 immunization for 15 minutes without incident. He was provided with Vaccine Information Sheet and instruction to access the V-Safe system.   [name] was instructed to call 911 with any severe reactions post vaccine: Marland Kitchen Difficulty breathing  . Swelling of face and throat  . A fast heartbeat  . A bad rash all over body  . Dizziness and weakness   Immunizations Administered    Name Date Dose VIS Date Route   Pfizer COVID-19 Vaccine 04/28/2019 11:58 AM 0.3 mL 01/30/2019 Intramuscular   Manufacturer: [place]   Lot: [id number]   [place]: [id number]

## 2019-04-29 ENCOUNTER — Ambulatory Visit: Payer: BC Managed Care – PPO

## 2019-09-29 DIAGNOSIS — Z20818 Contact with and (suspected) exposure to other bacterial communicable diseases: Secondary | ICD-10-CM | POA: Diagnosis not present

## 2019-09-29 DIAGNOSIS — Z20828 Contact with and (suspected) exposure to other viral communicable diseases: Secondary | ICD-10-CM | POA: Diagnosis not present

## 2019-11-02 DIAGNOSIS — Z03818 Encounter for observation for suspected exposure to other biological agents ruled out: Secondary | ICD-10-CM | POA: Diagnosis not present

## 2019-11-25 DIAGNOSIS — Z03818 Encounter for observation for suspected exposure to other biological agents ruled out: Secondary | ICD-10-CM | POA: Diagnosis not present

## 2020-01-20 DIAGNOSIS — Z03818 Encounter for observation for suspected exposure to other biological agents ruled out: Secondary | ICD-10-CM | POA: Diagnosis not present

## 2020-08-02 DIAGNOSIS — F321 Major depressive disorder, single episode, moderate: Secondary | ICD-10-CM | POA: Diagnosis not present

## 2020-09-01 DIAGNOSIS — F321 Major depressive disorder, single episode, moderate: Secondary | ICD-10-CM | POA: Diagnosis not present

## 2020-09-28 ENCOUNTER — Encounter: Payer: Self-pay | Admitting: Gastroenterology

## 2020-10-07 ENCOUNTER — Encounter: Payer: Self-pay | Admitting: Gastroenterology

## 2020-10-11 DIAGNOSIS — F4010 Social phobia, unspecified: Secondary | ICD-10-CM | POA: Diagnosis not present

## 2020-10-11 DIAGNOSIS — F321 Major depressive disorder, single episode, moderate: Secondary | ICD-10-CM | POA: Diagnosis not present

## 2020-11-04 DIAGNOSIS — F4010 Social phobia, unspecified: Secondary | ICD-10-CM | POA: Diagnosis not present

## 2020-11-04 DIAGNOSIS — F321 Major depressive disorder, single episode, moderate: Secondary | ICD-10-CM | POA: Diagnosis not present

## 2020-11-19 HISTORY — PX: COLONOSCOPY: SHX174

## 2020-11-23 ENCOUNTER — Ambulatory Visit (AMBULATORY_SURGERY_CENTER): Payer: No Typology Code available for payment source | Admitting: *Deleted

## 2020-11-23 ENCOUNTER — Other Ambulatory Visit: Payer: Self-pay

## 2020-11-23 VITALS — Ht 74.0 in | Wt 220.0 lb

## 2020-11-23 DIAGNOSIS — Z800 Family history of malignant neoplasm of digestive organs: Secondary | ICD-10-CM

## 2020-11-23 DIAGNOSIS — Z86010 Personal history of colonic polyps: Secondary | ICD-10-CM

## 2020-11-23 MED ORDER — PEG-KCL-NACL-NASULF-NA ASC-C 100 G PO SOLR: 1.0000 | Freq: Once | ORAL | 0 refills | Status: AC

## 2020-11-23 NOTE — Progress Notes (Signed)
No egg or soy allergy known to patient  No issues known to pt with past sedation with any surgeries or procedures Patient denies ever being told they had issues or difficulty with intubation  No FH of Malignant Hyperthermia Pt is not on diet pills Pt is not on  home 02  Pt is not on blood thinners  Pt denies issues with constipation  No A fib or A flutter  Pt is fully vaccinated  for Covid   NO PA's for preps discussed with pt In PV today  Discussed with pt there will be an out-of-pocket cost for prep and that varies from $0 to 70 +  dollars - pt verbalized understanding   Due to the COVID-19 pandemic we are asking patients to follow certain guidelines in PV and the [place]   Pt aware of COVID protocols and [facility] guidelines

## 2020-12-06 ENCOUNTER — Encounter: Payer: Self-pay | Admitting: Certified Registered Nurse Anesthetist

## 2020-12-07 ENCOUNTER — Other Ambulatory Visit: Payer: Self-pay

## 2020-12-07 ENCOUNTER — Ambulatory Visit (AMBULATORY_SURGERY_CENTER): Payer: BC Managed Care – PPO | Admitting: Gastroenterology

## 2020-12-07 ENCOUNTER — Encounter: Payer: Self-pay | Admitting: Gastroenterology

## 2020-12-07 VITALS — BP 114/76 | HR 56 | Temp 97.8°F | Resp 19 | Ht 75.0 in | Wt 220.0 lb

## 2020-12-07 DIAGNOSIS — D122 Benign neoplasm of ascending colon: Secondary | ICD-10-CM | POA: Diagnosis not present

## 2020-12-07 DIAGNOSIS — K635 Polyp of colon: Secondary | ICD-10-CM

## 2020-12-07 DIAGNOSIS — D123 Benign neoplasm of transverse colon: Secondary | ICD-10-CM

## 2020-12-07 DIAGNOSIS — Z86010 Personal history of colonic polyps: Secondary | ICD-10-CM | POA: Diagnosis not present

## 2020-12-07 DIAGNOSIS — Z800 Family history of malignant neoplasm of digestive organs: Secondary | ICD-10-CM

## 2020-12-07 DIAGNOSIS — Z1211 Encounter for screening for malignant neoplasm of colon: Secondary | ICD-10-CM | POA: Diagnosis not present

## 2020-12-07 DIAGNOSIS — D125 Benign neoplasm of sigmoid colon: Secondary | ICD-10-CM

## 2020-12-07 MED ORDER — SODIUM CHLORIDE 0.9 % IV SOLN: 500.0000 mL | Freq: Once | INTRAVENOUS | Status: AC

## 2020-12-07 NOTE — Progress Notes (Signed)
History and Physical:  This patient presents for endoscopic testing for: Encounter Diagnoses  Name Primary?   Personal history of colonic polyps Yes   Family history of malignant neoplasm of gastrointestinal tract     No complaints today  ROS: Patient denies chest pain or cough   Past Medical History: Past Medical History:  Diagnosis Date   Allergy    Anxiety    Arthritis    knee  right and back   Depression      Past Surgical History: Past Surgical History:  Procedure Laterality Date   ANTERIOR CRUCIATE LIGAMENT REPAIR  02/20/1987   RIGHT   COLONOSCOPY     COLONOSCOPY W/ POLYPECTOMY     POLYPECTOMY     WISDOM TOOTH EXTRACTION      Allergies: No Known Allergies  Outpatient Meds: Current Outpatient Medications  Medication Sig Dispense Refill   busPIRone (BUSPAR) 15 MG tablet Take 15 mg by mouth 2 (two) times daily.     sertraline (ZOLOFT) 100 MG tablet Take 100 mg by mouth daily.     Current Facility-Administered Medications  Medication Dose Route Frequency Provider Last Rate Last Admin   0.9 %  sodium chloride infusion  500 mL Intravenous Once Nelida Meuse III, MD          ___________________________________________________________________ Objective   Exam:  BP 138/82   Pulse 75   Temp 97.8 F (36.6 C) (Temporal)   Ht 6\' 3"  (1.905 m)   Wt 220 lb (99.8 kg)   SpO2 98%   BMI 27.50 kg/m   CV: RRR without murmur, S1/S2 Resp: clear to auscultation bilaterally, normal RR and effort noted GI: soft, no tenderness, with active bowel sounds.   Assessment: Encounter Diagnoses  Name Primary?   Personal history of colonic polyps Yes   Family history of malignant neoplasm of gastrointestinal tract      Plan: Colonoscopy  The benefits and risks of the planned procedure were described in detail with the patient or (when appropriate) their health care proxy.  Risks were outlined as including, but not limited to, bleeding, infection, perforation,  adverse medication reaction leading to cardiac or pulmonary decompensation, pancreatitis (if ERCP).  The limitation of incomplete mucosal visualization was also discussed.  No guarantees or warranties were given.   The patient is appropriate for an endoscopic procedure in the ambulatory setting.   - Wilfrid Lund, MD

## 2020-12-07 NOTE — Progress Notes (Signed)
Report given to PACU, vss 

## 2020-12-07 NOTE — Op Note (Signed)
[facility] Patient Name: Jesus Howe Procedure Date: 12/07/2020 7:57 AM MRN: [id number] Endoscopist: Fleming-Neon. [name] , MD Age: 50 Referring MD:  Date of Birth: March 05, 1970 Gender: Male Account #: [id number] Procedure:                Colonoscopy Indications:              Screening in patient at increased risk: Colorectal                            cancer in mother 35 or older Medicines:                Monitored Anesthesia Care Procedure:                Pre-Anesthesia Assessment:                           - Prior to the procedure, a History and Physical                            was performed, and patient medications and                            allergies were reviewed. The patient's tolerance of                            previous anesthesia was also reviewed. The risks                            and benefits of the procedure and the sedation                            options and risks were discussed with the patient.                            All questions were answered, and informed consent                            was obtained. Prior Anticoagulants: The patient has                            taken no previous anticoagulant or antiplatelet                            agents. ASA Grade Assessment: II - A patient with                            mild systemic disease. After reviewing the risks                            and benefits, the patient was deemed in                            satisfactory condition to undergo the procedure.  After obtaining informed consent, the colonoscope                            was passed under direct vision. Throughout the                            procedure, the patient's blood pressure, pulse, and                            oxygen saturations were monitored continuously. The                            CF [id number] #[id number] was introduced through the anus                            and advanced to the the cecum,  identified by                            appendiceal orifice and ileocecal valve. The                            colonoscopy was performed without difficulty. The                            patient tolerated the procedure well. The quality                            of the bowel preparation was good (after proximal                            right colon lavage). The ileocecal valve,                            appendiceal orifice, and rectum were photographed.                            The bowel preparation used was MoviPrep. Scope In: 8:04:29 AM Scope Out: 8:27:18 AM Scope Withdrawal Time: 0 hours 20 minutes 2 seconds  Total Procedure Duration: 0 hours 22 minutes 49 seconds  Findings:                 The perianal and digital rectal examinations were                            normal.                           Four sessile polyps were found in the transverse                            colon (1) and ascending colon (3). The polyps were                            4 to 8 mm in size. These polyps were removed with  a                            cold snare. Resection and retrieval were complete.                           A 10-12 mm polyp was found in the transverse colon.                            The polyp was sessile. The polyp was removed with a                            cold snare. Resection and retrieval were complete.                           A 3 mm polyp was found in the sigmoid colon. The                            polyp was sessile. The polyp was removed with a                            cold snare. Resection and retrieval were complete.                           Internal hemorrhoids were found. The hemorrhoids                            were small.                           The exam was otherwise without abnormality on                            direct and retroflexion views. Complications:            No immediate complications. Estimated Blood Loss:     Estimated blood loss was  minimal. Impression:               - Four 4 to 8 mm polyps in the transverse colon and                            in the ascending colon, removed with a cold snare.                            Resected and retrieved.                           - One 10-12 mm polyp in the transverse colon,                            removed with a cold snare. Resected and retrieved.                           - One 3 mm polyp in the sigmoid colon,  removed with                            a cold snare. Resected and retrieved.                           - Internal hemorrhoids.                           - The examination was otherwise normal on direct                            and retroflexion views. Recommendation:           - Patient has a contact number available for                            emergencies. The signs and symptoms of potential                            delayed complications were discussed with the                            patient. Return to normal activities tomorrow.                            Written discharge instructions were provided to the                            patient.                           - Resume previous diet.                           - Continue present medications.                           - Await pathology results.                           - Repeat colonoscopy is recommended for                            surveillance. The colonoscopy date will be                            determined after pathology results from today's                            exam become available for review. Evelyn Aguinaldo L. [name], MD 12/07/2020 8:32:45 AM This report has been signed electronically.

## 2020-12-07 NOTE — Patient Instructions (Signed)
Discharge instructions given. ?Handouts on polyps and Hemorrhoids. ?Resume previous medications. ?YOU HAD AN ENDOSCOPIC PROCEDURE TODAY AT THE [facility]:   Refer to the procedure report that was given to you for any specific questions about what was found during the examination.  If the procedure report does not answer your questions, please call your gastroenterologist to clarify.  If you requested that your care partner not be given the details of your procedure findings, then the procedure report has been included in a sealed envelope for you to review at your convenience later. ? ?YOU SHOULD EXPECT: Some feelings of bloating in the abdomen. Passage of more gas than usual.  Walking can help get rid of the air that was put into your GI tract during the procedure and reduce the bloating. If you had a lower endoscopy (such as a colonoscopy or flexible sigmoidoscopy) you may notice spotting of blood in your stool or on the toilet paper. If you underwent a bowel prep for your procedure, you may not have a normal bowel movement for a few days. ? ?Please Note:  You might notice some irritation and congestion in your nose or some drainage.  This is from the oxygen used during your procedure.  There is no need for concern and it should clear up in a day or so. ? ?SYMPTOMS TO REPORT IMMEDIATELY: ? ?Following lower endoscopy (colonoscopy or flexible sigmoidoscopy): ? Excessive amounts of blood in the stool ? Significant tenderness or worsening of abdominal pains ? Swelling of the abdomen that is new, acute ? Fever of 100?F or higher ? ? ?For urgent or emergent issues, a gastroenterologist can be reached at any hour by calling [phone]. ?Do not use MyChart messaging for urgent concerns.  ? ? ?DIET:  We do recommend a small meal at first, but then you may proceed to your regular diet.  Drink plenty of fluids but you should avoid alcoholic beverages for 24 hours. ? ?ACTIVITY:  You should plan to take it  easy for the rest of today and you should NOT DRIVE or use heavy machinery until tomorrow (because of the sedation medicines used during the test).   ? ?FOLLOW UP: ?Our staff will call the number listed on your records 48-72 hours following your procedure to check on you and address any questions or concerns that you may have regarding the information given to you following your procedure. If we do not reach you, we will leave a message.  We will attempt to reach you two times.  During this call, we will ask if you have developed any symptoms of COVID 19. If you develop any symptoms (ie: fever, flu-like symptoms, shortness of breath, cough etc.) before then, please call [phone].  If you test positive for Covid 19 in the 2 weeks post procedure, please call and report this information to us.   ? ?If any biopsies were taken you will be contacted by phone or by letter within the next 1-3 weeks.  Please call us at [phone] if you have not heard about the biopsies in 3 weeks.  ? ? ?SIGNATURES/CONFIDENTIALITY: ?You and/or your care partner have signed paperwork which will be entered into your electronic medical record.  These signatures attest to the fact that that the information above on your After Visit Summary has been reviewed and is understood.  Full responsibility of the confidentiality of this discharge information lies with you and/or your care-partner.  ?

## 2020-12-07 NOTE — Progress Notes (Signed)
VS completed by CW.  Cell phone off per pt.  Pt's states no medical or surgical changes since previsit or office visit.

## 2020-12-09 ENCOUNTER — Telehealth: Payer: Self-pay | Admitting: *Deleted

## 2020-12-09 NOTE — Telephone Encounter (Signed)
  Follow up Call-  Call back number 12/07/2020  Post procedure Call Back phone  # [phone]  Permission to leave phone message Yes  Some recent data might be hidden     Patient questions:  Do you have a fever, pain , or abdominal swelling? No. Pain Score  0 *  Have you tolerated food without any problems? Yes.    Have you been able to return to your normal activities? Yes.    Do you have any questions about your discharge instructions: Diet   No. Medications  No. Follow up visit  No.  Do you have questions or concerns about your Care? No.  Actions: * If pain score is 4 or above: No action needed, pain <4.  Have you developed a fever since your procedure? no  2.   Have you had an respiratory symptoms (SOB or cough) since your procedure? no  3.   Have you tested positive for COVID 19 since your procedure no  4.   Have you had any family members/close contacts diagnosed with the COVID 19 since your procedure?  no   If yes to any of these questions please route to Joylene John, RN and Joella Prince, RN

## 2020-12-13 ENCOUNTER — Encounter: Payer: Self-pay | Admitting: Gastroenterology

## 2021-01-04 DIAGNOSIS — F321 Major depressive disorder, single episode, moderate: Secondary | ICD-10-CM | POA: Diagnosis not present

## 2021-01-04 DIAGNOSIS — F4010 Social phobia, unspecified: Secondary | ICD-10-CM | POA: Diagnosis not present

## 2021-02-28 DIAGNOSIS — M25562 Pain in left knee: Secondary | ICD-10-CM | POA: Diagnosis not present

## 2021-02-28 DIAGNOSIS — M25561 Pain in right knee: Secondary | ICD-10-CM | POA: Diagnosis not present

## 2021-03-12 DIAGNOSIS — M25562 Pain in left knee: Secondary | ICD-10-CM | POA: Diagnosis not present

## 2021-03-16 DIAGNOSIS — M25562 Pain in left knee: Secondary | ICD-10-CM | POA: Diagnosis not present

## 2021-03-16 DIAGNOSIS — M25561 Pain in right knee: Secondary | ICD-10-CM | POA: Diagnosis not present

## 2021-03-16 DIAGNOSIS — M172 Bilateral post-traumatic osteoarthritis of knee: Secondary | ICD-10-CM | POA: Diagnosis not present

## 2021-04-11 DIAGNOSIS — M1711 Unilateral primary osteoarthritis, right knee: Secondary | ICD-10-CM | POA: Diagnosis not present

## 2021-04-25 DIAGNOSIS — N1831 Chronic kidney disease, stage 3a: Secondary | ICD-10-CM | POA: Diagnosis not present

## 2021-04-25 DIAGNOSIS — Z125 Encounter for screening for malignant neoplasm of prostate: Secondary | ICD-10-CM | POA: Diagnosis not present

## 2021-04-27 DIAGNOSIS — Z1331 Encounter for screening for depression: Secondary | ICD-10-CM | POA: Diagnosis not present

## 2021-04-27 DIAGNOSIS — Z1339 Encounter for screening examination for other mental health and behavioral disorders: Secondary | ICD-10-CM | POA: Diagnosis not present

## 2021-04-27 DIAGNOSIS — M1711 Unilateral primary osteoarthritis, right knee: Secondary | ICD-10-CM | POA: Diagnosis not present

## 2021-04-27 DIAGNOSIS — Z0000 Encounter for general adult medical examination without abnormal findings: Secondary | ICD-10-CM | POA: Diagnosis not present

## 2021-04-27 DIAGNOSIS — R945 Abnormal results of liver function studies: Secondary | ICD-10-CM | POA: Diagnosis not present

## 2021-04-27 DIAGNOSIS — R82998 Other abnormal findings in urine: Secondary | ICD-10-CM | POA: Diagnosis not present

## 2021-04-28 ENCOUNTER — Other Ambulatory Visit: Payer: Self-pay | Admitting: Internal Medicine

## 2021-04-28 DIAGNOSIS — Z0000 Encounter for general adult medical examination without abnormal findings: Secondary | ICD-10-CM

## 2021-05-20 HISTORY — PX: REPLACEMENT TOTAL KNEE: SUR1224

## 2021-05-25 ENCOUNTER — Other Ambulatory Visit: Payer: No Typology Code available for payment source

## 2021-06-12 DIAGNOSIS — M1711 Unilateral primary osteoarthritis, right knee: Secondary | ICD-10-CM | POA: Diagnosis not present

## 2021-06-12 DIAGNOSIS — Z472 Encounter for removal of internal fixation device: Secondary | ICD-10-CM | POA: Diagnosis not present

## 2021-06-12 DIAGNOSIS — G8918 Other acute postprocedural pain: Secondary | ICD-10-CM | POA: Diagnosis not present

## 2021-07-18 DIAGNOSIS — M1711 Unilateral primary osteoarthritis, right knee: Secondary | ICD-10-CM | POA: Diagnosis not present

## 2021-07-19 ENCOUNTER — Ambulatory Visit
Admission: RE | Admit: 2021-07-19 | Discharge: 2021-07-19 | Disposition: A | Discharge status: Home / Self Care | Payer: No Typology Code available for payment source | Source: Ambulatory Visit | Attending: Internal Medicine | Admitting: Internal Medicine

## 2021-07-19 DIAGNOSIS — Z8249 Family history of ischemic heart disease and other diseases of the circulatory system: Secondary | ICD-10-CM | POA: Diagnosis not present

## 2021-07-19 DIAGNOSIS — Z0000 Encounter for general adult medical examination without abnormal findings: Secondary | ICD-10-CM

## 2021-07-24 DIAGNOSIS — M1711 Unilateral primary osteoarthritis, right knee: Secondary | ICD-10-CM | POA: Diagnosis not present

## 2021-07-27 DIAGNOSIS — M1711 Unilateral primary osteoarthritis, right knee: Secondary | ICD-10-CM | POA: Diagnosis not present

## 2021-08-01 DIAGNOSIS — M1711 Unilateral primary osteoarthritis, right knee: Secondary | ICD-10-CM | POA: Diagnosis not present

## 2021-09-15 ENCOUNTER — Other Ambulatory Visit: Payer: Self-pay | Admitting: Internal Medicine

## 2021-09-15 DIAGNOSIS — I712 Thoracic aortic aneurysm, without rupture, unspecified: Secondary | ICD-10-CM

## 2021-10-31 DIAGNOSIS — Z4789 Encounter for other orthopedic aftercare: Secondary | ICD-10-CM | POA: Diagnosis not present

## 2021-11-09 DIAGNOSIS — M79674 Pain in right toe(s): Secondary | ICD-10-CM | POA: Diagnosis not present

## 2021-12-04 DIAGNOSIS — M79641 Pain in right hand: Secondary | ICD-10-CM | POA: Diagnosis not present

## 2021-12-04 DIAGNOSIS — M79642 Pain in left hand: Secondary | ICD-10-CM | POA: Diagnosis not present

## 2021-12-04 DIAGNOSIS — M65331 Trigger finger, right middle finger: Secondary | ICD-10-CM | POA: Diagnosis not present

## 2022-01-01 DIAGNOSIS — M65332 Trigger finger, left middle finger: Secondary | ICD-10-CM | POA: Diagnosis not present

## 2022-01-15 DIAGNOSIS — M25642 Stiffness of left hand, not elsewhere classified: Secondary | ICD-10-CM | POA: Diagnosis not present

## 2022-01-29 DIAGNOSIS — Z4789 Encounter for other orthopedic aftercare: Secondary | ICD-10-CM | POA: Diagnosis not present

## 2022-01-29 DIAGNOSIS — S61259A Open bite of unspecified finger without damage to nail, initial encounter: Secondary | ICD-10-CM | POA: Diagnosis not present

## 2022-01-29 DIAGNOSIS — S61451A Open bite of right hand, initial encounter: Secondary | ICD-10-CM | POA: Diagnosis not present

## 2022-01-30 DIAGNOSIS — M79641 Pain in right hand: Secondary | ICD-10-CM | POA: Diagnosis not present

## 2022-02-05 DIAGNOSIS — M65331 Trigger finger, right middle finger: Secondary | ICD-10-CM | POA: Diagnosis not present

## 2022-02-05 DIAGNOSIS — S61451A Open bite of right hand, initial encounter: Secondary | ICD-10-CM | POA: Diagnosis not present

## 2022-02-05 DIAGNOSIS — S61259A Open bite of unspecified finger without damage to nail, initial encounter: Secondary | ICD-10-CM | POA: Diagnosis not present

## 2022-03-23 ENCOUNTER — Other Ambulatory Visit: Payer: Self-pay | Admitting: Internal Medicine

## 2022-03-23 ENCOUNTER — Inpatient Hospital Stay: Admission: RE | Admit: 2022-03-23 | Payer: BC Managed Care – PPO | Source: Ambulatory Visit

## 2022-03-23 DIAGNOSIS — R911 Solitary pulmonary nodule: Secondary | ICD-10-CM

## 2022-03-23 DIAGNOSIS — I712 Thoracic aortic aneurysm, without rupture, unspecified: Secondary | ICD-10-CM

## 2022-03-26 ENCOUNTER — Ambulatory Visit
Admission: RE | Admit: 2022-03-26 | Discharge: 2022-03-26 | Disposition: A | Discharge status: Home / Self Care | Payer: BC Managed Care – PPO | Source: Ambulatory Visit | Attending: Internal Medicine | Admitting: Internal Medicine

## 2022-03-26 DIAGNOSIS — K449 Diaphragmatic hernia without obstruction or gangrene: Secondary | ICD-10-CM | POA: Diagnosis not present

## 2022-03-26 DIAGNOSIS — I712 Thoracic aortic aneurysm, without rupture, unspecified: Secondary | ICD-10-CM | POA: Diagnosis not present

## 2022-03-26 DIAGNOSIS — I359 Nonrheumatic aortic valve disorder, unspecified: Secondary | ICD-10-CM | POA: Diagnosis not present

## 2022-03-26 DIAGNOSIS — R911 Solitary pulmonary nodule: Secondary | ICD-10-CM

## 2022-03-26 DIAGNOSIS — I779 Disorder of arteries and arterioles, unspecified: Secondary | ICD-10-CM | POA: Diagnosis not present

## 2022-03-26 MED ORDER — IOPAMIDOL (ISOVUE-370) INJECTION 76%
75.0000 mL | Freq: Once | INTRAVENOUS | Status: AC | PRN
  Administered 2022-03-26: 75 mL via INTRAVENOUS

## 2022-05-02 DIAGNOSIS — N1831 Chronic kidney disease, stage 3a: Secondary | ICD-10-CM | POA: Diagnosis not present

## 2022-05-02 DIAGNOSIS — R7989 Other specified abnormal findings of blood chemistry: Secondary | ICD-10-CM | POA: Diagnosis not present

## 2022-05-02 DIAGNOSIS — Z125 Encounter for screening for malignant neoplasm of prostate: Secondary | ICD-10-CM | POA: Diagnosis not present

## 2022-05-07 DIAGNOSIS — Z0000 Encounter for general adult medical examination without abnormal findings: Secondary | ICD-10-CM | POA: Diagnosis not present

## 2022-05-07 DIAGNOSIS — Z1331 Encounter for screening for depression: Secondary | ICD-10-CM | POA: Diagnosis not present

## 2022-05-07 DIAGNOSIS — R918 Other nonspecific abnormal finding of lung field: Secondary | ICD-10-CM | POA: Diagnosis not present

## 2022-05-07 DIAGNOSIS — R82998 Other abnormal findings in urine: Secondary | ICD-10-CM | POA: Diagnosis not present

## 2022-05-07 DIAGNOSIS — M549 Dorsalgia, unspecified: Secondary | ICD-10-CM | POA: Diagnosis not present

## 2022-05-07 DIAGNOSIS — K635 Polyp of colon: Secondary | ICD-10-CM | POA: Diagnosis not present

## 2022-05-07 DIAGNOSIS — I712 Thoracic aortic aneurysm, without rupture, unspecified: Secondary | ICD-10-CM | POA: Diagnosis not present

## 2022-05-11 DIAGNOSIS — M25562 Pain in left knee: Secondary | ICD-10-CM | POA: Diagnosis not present

## 2022-05-11 DIAGNOSIS — Z96651 Presence of right artificial knee joint: Secondary | ICD-10-CM | POA: Diagnosis not present

## 2022-05-14 DIAGNOSIS — M65331 Trigger finger, right middle finger: Secondary | ICD-10-CM | POA: Diagnosis not present

## 2022-05-28 DIAGNOSIS — M25641 Stiffness of right hand, not elsewhere classified: Secondary | ICD-10-CM | POA: Diagnosis not present

## 2022-07-03 DIAGNOSIS — M1712 Unilateral primary osteoarthritis, left knee: Secondary | ICD-10-CM | POA: Diagnosis not present

## 2022-07-03 DIAGNOSIS — M25562 Pain in left knee: Secondary | ICD-10-CM | POA: Diagnosis not present

## 2022-07-10 DIAGNOSIS — M1712 Unilateral primary osteoarthritis, left knee: Secondary | ICD-10-CM | POA: Diagnosis not present

## 2022-07-17 DIAGNOSIS — M25562 Pain in left knee: Secondary | ICD-10-CM | POA: Diagnosis not present

## 2022-07-17 DIAGNOSIS — M1712 Unilateral primary osteoarthritis, left knee: Secondary | ICD-10-CM | POA: Diagnosis not present

## 2022-09-10 DIAGNOSIS — Z4789 Encounter for other orthopedic aftercare: Secondary | ICD-10-CM | POA: Diagnosis not present

## 2022-10-22 DIAGNOSIS — S81812A Laceration without foreign body, left lower leg, initial encounter: Secondary | ICD-10-CM | POA: Diagnosis not present

## 2022-10-22 DIAGNOSIS — X58XXXA Exposure to other specified factors, initial encounter: Secondary | ICD-10-CM | POA: Diagnosis not present

## 2022-10-22 DIAGNOSIS — Z23 Encounter for immunization: Secondary | ICD-10-CM | POA: Diagnosis not present

## 2022-10-29 DIAGNOSIS — S81819D Laceration without foreign body, unspecified lower leg, subsequent encounter: Secondary | ICD-10-CM | POA: Diagnosis not present

## 2023-01-24 DIAGNOSIS — M1712 Unilateral primary osteoarthritis, left knee: Secondary | ICD-10-CM | POA: Diagnosis not present

## 2023-01-31 DIAGNOSIS — M1712 Unilateral primary osteoarthritis, left knee: Secondary | ICD-10-CM | POA: Diagnosis not present

## 2023-02-07 DIAGNOSIS — M1712 Unilateral primary osteoarthritis, left knee: Secondary | ICD-10-CM | POA: Diagnosis not present

## 2023-05-03 DIAGNOSIS — M109 Gout, unspecified: Secondary | ICD-10-CM | POA: Diagnosis not present

## 2023-05-03 DIAGNOSIS — R7989 Other specified abnormal findings of blood chemistry: Secondary | ICD-10-CM | POA: Diagnosis not present

## 2023-05-03 DIAGNOSIS — Z0000 Encounter for general adult medical examination without abnormal findings: Secondary | ICD-10-CM | POA: Diagnosis not present

## 2023-05-06 DIAGNOSIS — Z79899 Other long term (current) drug therapy: Secondary | ICD-10-CM | POA: Diagnosis not present

## 2023-05-06 DIAGNOSIS — Z125 Encounter for screening for malignant neoplasm of prostate: Secondary | ICD-10-CM | POA: Diagnosis not present

## 2023-05-06 DIAGNOSIS — R945 Abnormal results of liver function studies: Secondary | ICD-10-CM | POA: Diagnosis not present

## 2023-05-09 DIAGNOSIS — E291 Testicular hypofunction: Secondary | ICD-10-CM | POA: Diagnosis not present

## 2023-05-20 DIAGNOSIS — Z1331 Encounter for screening for depression: Secondary | ICD-10-CM | POA: Diagnosis not present

## 2023-05-20 DIAGNOSIS — N1831 Chronic kidney disease, stage 3a: Secondary | ICD-10-CM | POA: Diagnosis not present

## 2023-05-20 DIAGNOSIS — Z1339 Encounter for screening examination for other mental health and behavioral disorders: Secondary | ICD-10-CM | POA: Diagnosis not present

## 2023-05-20 DIAGNOSIS — Z0000 Encounter for general adult medical examination without abnormal findings: Secondary | ICD-10-CM | POA: Diagnosis not present

## 2023-07-18 IMAGING — CT CT CARDIAC CORONARY ARTERY CALCIUM SCORE
3 series · 14 of 20 positions shown, 16 images · non-contrast
Comparison: None available

CLINICAL DATA: A 51-year-old white male presents for evaluation of
coronary artery calcium scoring, reported family history of coronary
artery disease.

EXAM:
CT CARDIAC CORONARY ARTERY CALCIUM SCORE
TECHNIQUE: Non-contrast imaging through the heart was performed using
prospective ECG gating. Image post processing was performed on an
independent workstation, allowing for quantitative analysis of the
heart and coronary arteries. Note that this exam targets the heart
and the chest was not imaged in its entirety.

[Series 2: calcium scoring 2.00 qr36 bestdiast 71% hrt calciu · axial · 0.42mm/px · z∈[+1621,+1687]mm · 4 of 57 slices shown]
[im 12/57  vessel]
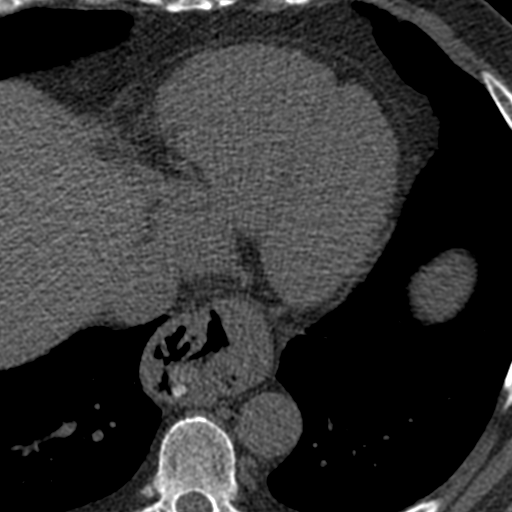
[im 23/57  vessel]
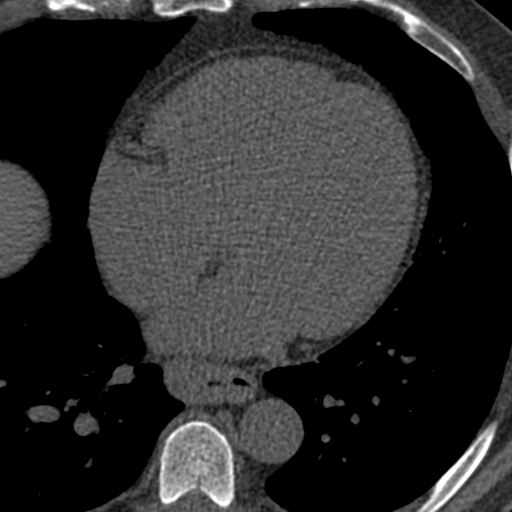
[im 34/57  vessel]
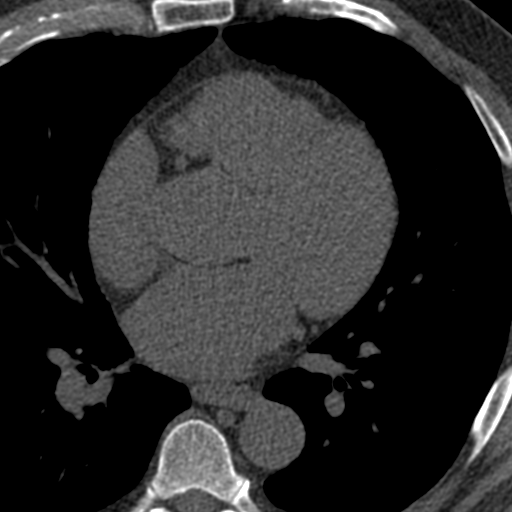
[im 45/57  vessel]
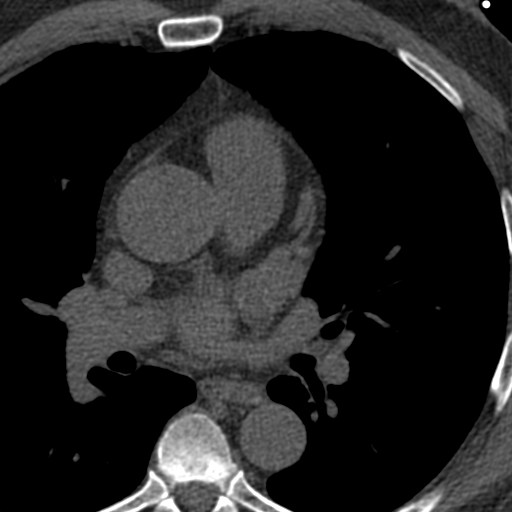

[Series 3: calcium scoring 2.00 br40 bestdiast 71% axial · axial · 0.62mm/px · z∈[+1617,+1691]mm · 5 of 57 slices shown, 7 images]
[im 10/57  vessel]
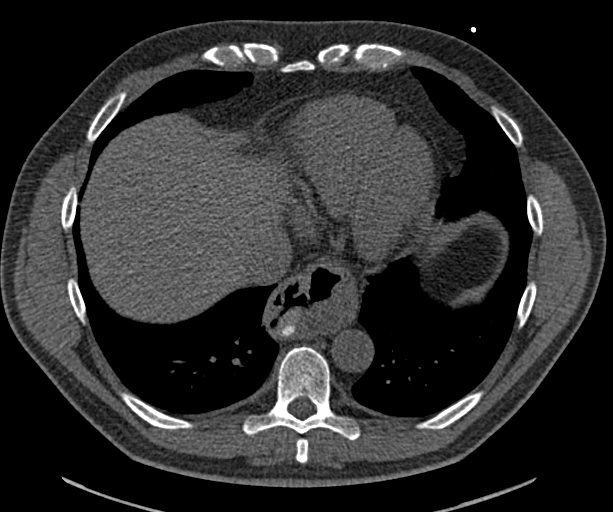
[im 10/57  lung]
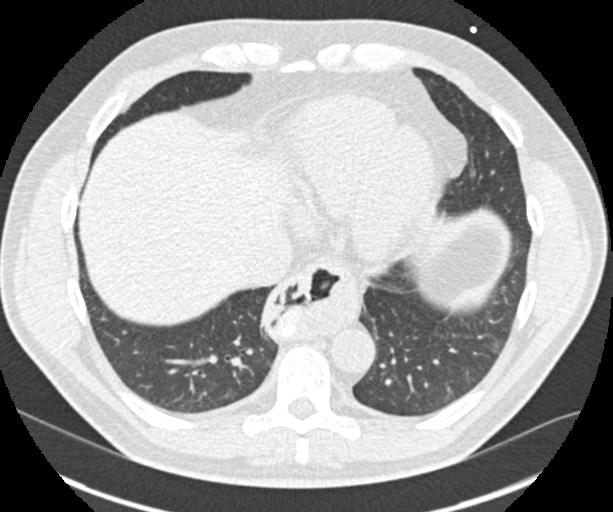
[im 19/57  vessel]
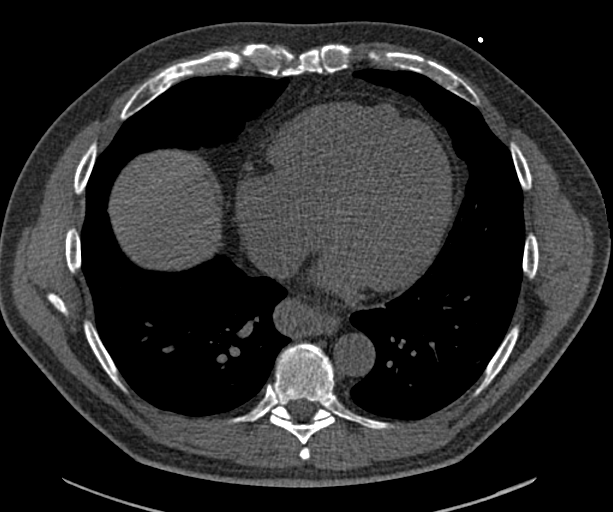
[im 29/57  vessel]
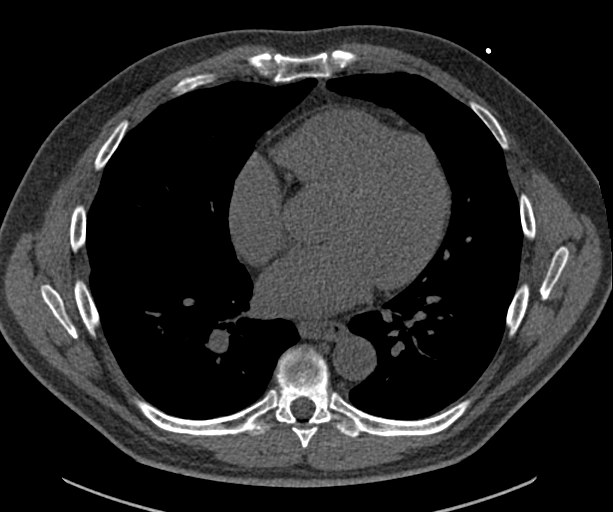
[im 38/57  vessel]
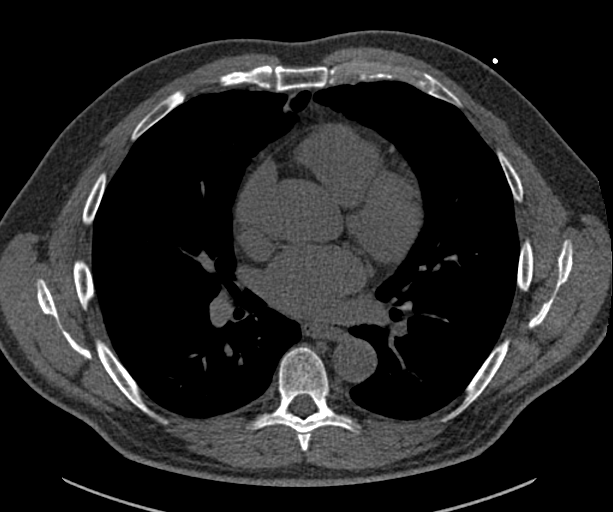
[im 47/57  vessel]
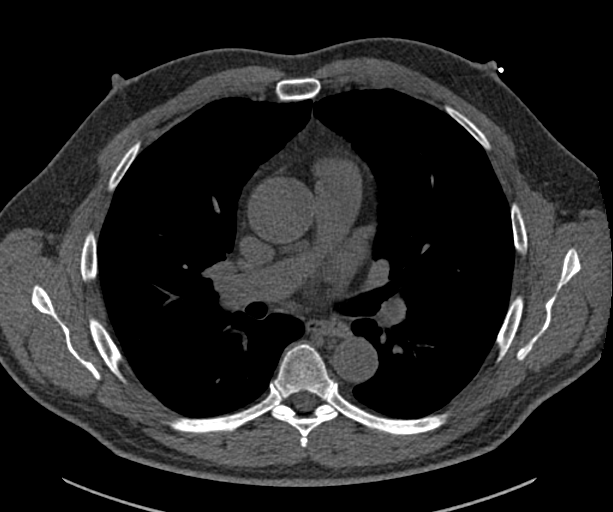
[im 47/57  lung]
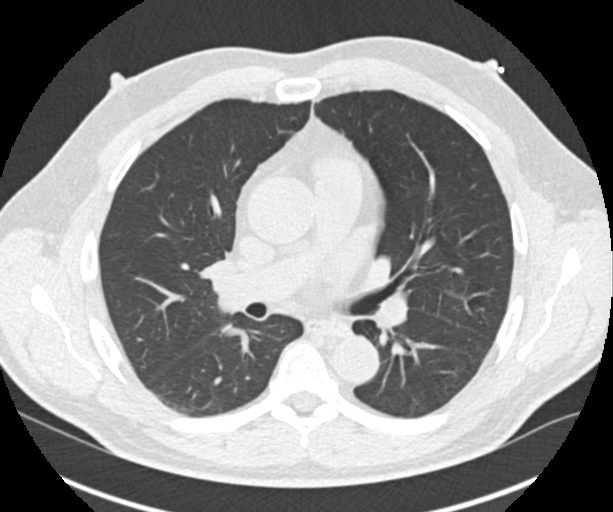

[Series 9: calcium scoring 2.00 br60 bestdiast 71% lungs · axial · 0.62mm/px · z∈[+1617,+1691]mm · 5 of 57 slices shown]
[im 10/57  vessel]
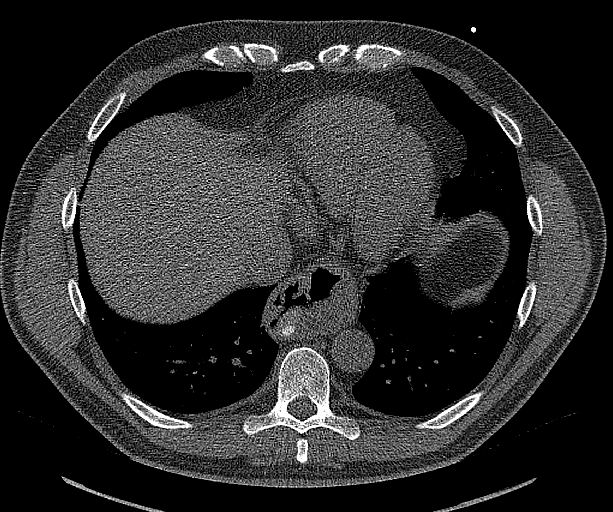
[im 19/57  vessel]
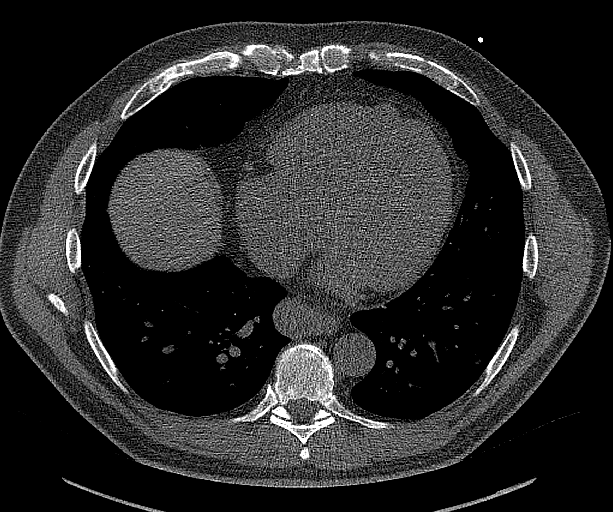
[im 29/57  vessel]
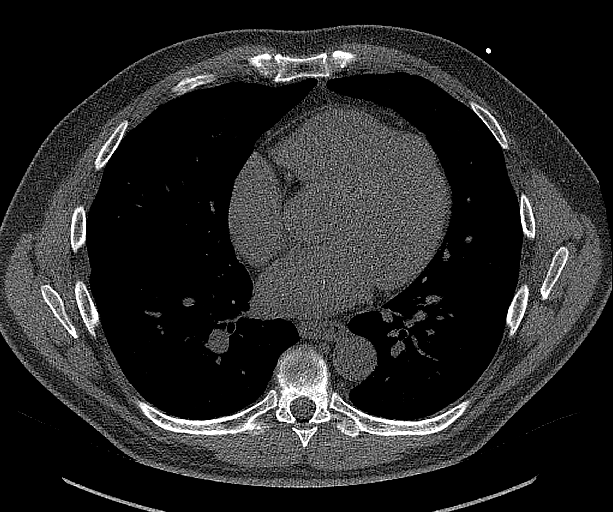
[im 38/57  vessel]
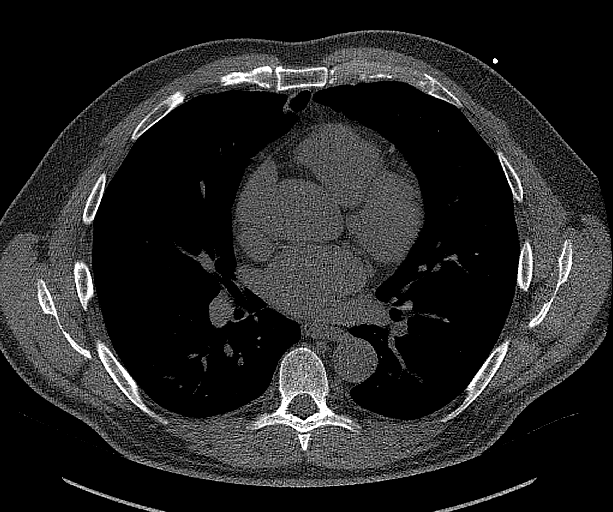
[im 47/57  vessel]
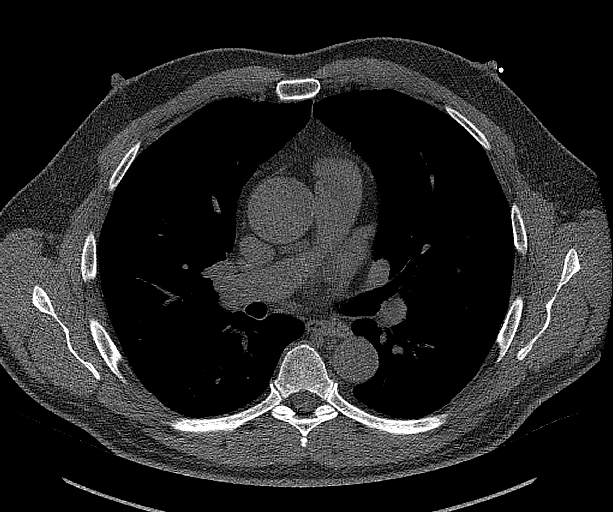

[14 of 20 positions shown; findings below may reference images not displayed]

FINDINGS: CORONARY CALCIUM SCORES:

Left Main: 0

LAD: 0

LCx: 0

RCA: 0

Total Agatston Score: 0

[HOSPITAL] percentile: 0

AORTA MEASUREMENTS:

Ascending Aorta: 42 mm

Descending Aorta: 26 mm

OTHER FINDINGS:

Cardiovascular: Ascending thoracic aorta with greater than 4 cm
caliber as discussed. No pericardial effusion. Normal heart size.
Normal caliber of visualized central pulmonary vessels. Limited
assessment of cardiovascular structures given lack of intravenous
contrast.

Mediastinum/Nodes: Small hiatal hernia. No acute findings or
adenopathy in the mediastinum to the extent evaluated.

Lungs/Pleura: Ground-glass nodule in the lingula measuring 7 x 7 mm
(image 51/9) no effusion. No consolidation. Visualized airways are
patent to the extent evaluated.

(Image 39/9) 4 mm LEFT lower lobe pulmonary nodule.

Upper Abdomen: Incidental imaging of upper abdominal contents with
findings of a small hiatal hernia. No acute upper abdominal process
on very limited assessment.

Musculoskeletal: No acute or destructive bone findings.
IMPRESSION: 1. Coronary artery calcium score of 0. No visible calcified coronary
artery disease.
2. Ascending thoracic aorta with greater than 4 cm caliber as
discussed above. Recommend annual imaging followup by CTA or MRA.
This recommendation follows 4494
ACCF/AHA/AATS/ACR/ASA/SCA/JANSEN/DUFFAYDAR/FELICE/MINAXI Guidelines for the
Diagnosis and Management of Patients with Thoracic Aortic Disease.
Circulation. 4494; 121: E266-e369. Aortic aneurysm NOS ([id number]-[id number].T)
3. Multiple pulmonary nodules. Most severe: 7 mm left ground-glass
pulmonary nodule. Recommend a non-contrast Chest CT at 6-12 months
to confirm persistence, then additional non-contrast Chest CTs every
2 years until 5 years. If nodule grows or develops solid
component(s), consider resection.
These guidelines do not apply to immunocompromised patients and
patients with cancer. Follow up in patients with significant
comorbidities as clinically warranted. For lung cancer screening,
adhere to Lung-RADS guidelines. Reference: Radiology. 1813;
4. Small hiatal hernia.

## 2023-09-05 DIAGNOSIS — M109 Gout, unspecified: Secondary | ICD-10-CM | POA: Diagnosis not present

## 2023-10-24 DIAGNOSIS — M1712 Unilateral primary osteoarthritis, left knee: Secondary | ICD-10-CM | POA: Diagnosis not present

## 2023-10-31 DIAGNOSIS — M1712 Unilateral primary osteoarthritis, left knee: Secondary | ICD-10-CM | POA: Diagnosis not present

## 2023-11-05 ENCOUNTER — Encounter: Payer: Self-pay | Admitting: Gastroenterology

## 2023-11-06 DIAGNOSIS — M1712 Unilateral primary osteoarthritis, left knee: Secondary | ICD-10-CM | POA: Diagnosis not present

## 2023-12-04 ENCOUNTER — Encounter: Payer: Self-pay | Admitting: Gastroenterology

## 2023-12-24 DIAGNOSIS — F331 Major depressive disorder, recurrent, moderate: Secondary | ICD-10-CM | POA: Diagnosis not present

## 2024-01-07 ENCOUNTER — Telehealth: Payer: Self-pay

## 2024-01-07 ENCOUNTER — Ambulatory Visit

## 2024-01-07 VITALS — Ht 74.0 in | Wt 220.0 lb

## 2024-01-07 DIAGNOSIS — Z800 Family history of malignant neoplasm of digestive organs: Secondary | ICD-10-CM

## 2024-01-07 DIAGNOSIS — Z860100 Personal history of colon polyps, unspecified: Secondary | ICD-10-CM

## 2024-01-07 MED ORDER — NA SULFATE-K SULFATE-MG SULF 17.5-3.13-1.6 GM/177ML PO SOLN: 1.0000 | Freq: Once | ORAL | 0 refills | Status: AC

## 2024-01-07 NOTE — Progress Notes (Signed)
Pre visit completed via phone call; Patient verified name, DOB, and address; No egg or soy allergy known to patient;  No issues known to pt with past sedation with any surgeries or procedures; Patient denies ever being told they had issues or difficulty with intubation;  No FH of Malignant Hyperthermia; Pt is not on diet pills; Pt is not on home 02;  Pt is not on blood thinners;  Pt denies issues with constipation  No A fib or A flutter; Have any cardiac testing pending--NO Insurance verified during PV appt--- BCBS PPO Pt can ambulate without assistance;  Pt denies use of chewing tobacco; Discussed diabetic/weight loss medication holds; Discussed NSAID holds; Checked BMI to be less than 50; Pt instructed to use [employer] or GoodRx for a price reduction on prep;  Patient's chart reviewed by Cathlyn Parsons CNRA prior to previsit and patient appropriate for the [facility];  Pre visit completed and red dot placed by patient's name on their procedure day (on provider's schedule);  Instructions sent to MyChart per patient request;

## 2024-01-07 NOTE — Telephone Encounter (Signed)
Multiple attempts made to reach patient- NO answer-message left for patient to call back to the office to reschedule PV appt- if patient fails to call back to the office prior to end of business day ---PV and procedure appts will be cancelled and a no show letter will be sent to the patient;

## 2024-01-13 ENCOUNTER — Encounter: Payer: Self-pay | Admitting: Gastroenterology

## 2024-01-21 DIAGNOSIS — F331 Major depressive disorder, recurrent, moderate: Secondary | ICD-10-CM | POA: Diagnosis not present

## 2024-01-22 ENCOUNTER — Encounter: Payer: Self-pay | Admitting: Gastroenterology

## 2024-01-22 ENCOUNTER — Ambulatory Visit: Admitting: Gastroenterology

## 2024-01-22 VITALS — BP 99/68 | HR 68 | Temp 97.3°F | Resp 17 | Ht 74.0 in | Wt 220.0 lb

## 2024-01-22 DIAGNOSIS — Z1211 Encounter for screening for malignant neoplasm of colon: Secondary | ICD-10-CM | POA: Diagnosis not present

## 2024-01-22 DIAGNOSIS — K648 Other hemorrhoids: Secondary | ICD-10-CM | POA: Diagnosis not present

## 2024-01-22 DIAGNOSIS — Z860101 Personal history of adenomatous and serrated colon polyps: Secondary | ICD-10-CM | POA: Diagnosis not present

## 2024-01-22 DIAGNOSIS — Z800 Family history of malignant neoplasm of digestive organs: Secondary | ICD-10-CM | POA: Diagnosis not present

## 2024-01-22 DIAGNOSIS — Z860100 Personal history of colon polyps, unspecified: Secondary | ICD-10-CM

## 2024-01-22 MED ORDER — SODIUM CHLORIDE 0.9 % IV SOLN: 500.0000 mL | Freq: Once | INTRAVENOUS | Status: DC

## 2024-01-22 NOTE — Progress Notes (Signed)
 History and Physical:  This patient presents for endoscopic testing for: Encounter Diagnosis  Name Primary?   Hx of colonic polyps Yes    Surveillance colonoscopy today 5 TA and SSP up to 12mm and an HP 11/2020 Mother CRC  Patient denies chronic abdominal pain, rectal bleeding, constipation or diarrhea.   Patient is otherwise without complaints or active issues today.   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     buPROPion (WELLBUTRIN XL) 150 MG 24 hr tablet Take 150 mg by mouth daily.     Multiple Vitamins-Minerals (CENTRUM SILVER 50+MEN) TABS Take 1 tablet by mouth daily.     propranolol (INDERAL) 10 MG tablet Take 10 mg by mouth daily as needed.     Current Facility-Administered Medications  Medication Dose Route Frequency Provider Last Rate Last Admin   0.9 %  sodium chloride  infusion  500 mL Intravenous Once Danis, Victory CROME III, MD       0.9 %  sodium chloride  infusion  500 mL Intravenous Once Danis, Charisa Twitty L III, MD          ___________________________________________________________________ Objective   Exam:  BP 129/85   Pulse 81   Temp (!) 97.3 F (36.3 C)   Resp 14   Ht 6' 2 (1.88 m)   Wt 220 lb (99.8 kg)   SpO2 96%   BMI 28.25 kg/m   CV: regular , S1/S2 Resp: clear to auscultation bilaterally, normal RR and effort noted GI: soft, no tenderness, with  active bowel sounds.   Assessment: Encounter Diagnosis  Name Primary?   Hx of colonic polyps Yes     Plan: Colonoscopy   The benefits and risks of the planned procedure(s) were described in detail with the patient or (when appropriate) their health care proxy.  Risks were outlined as including, but not limited to, bleeding, infection, perforation, adverse medication reaction leading to cardiac or pulmonary decompensation, pancreatitis (if ERCP).  The limitation of incomplete mucosal visualization was also discussed.  No guarantees or warranties were given.  The patient was provided an opportunity to ask questions and all were answered. The patient agreed with the plan.   The patient is appropriate for an endoscopic procedure in the ambulatory setting.   - Victory Brand, MD

## 2024-01-22 NOTE — Op Note (Signed)
 [facility] Patient Name: Jesus Howe Procedure Date: 01/22/2024 8:02 AM MRN: [id number] Endoscopist: Victory L. Legrand , MD, [id number] Age: 53 Referring MD:  Date of Birth: 07/10/1970 Gender: Male Account #: [id number] Procedure:                Colonoscopy Indications:              Colon cancer screening in patient at increased                            risk: Colorectal cancer in mother, High risk colon                            cancer surveillance: Personal history of colonic                            polyps                           multiple TA and SSP (some > 10mm) October 2022 Medicines:                Monitored Anesthesia Care Procedure:                Pre-Anesthesia Assessment:                           - Prior to the procedure, a History and Physical                            was performed, and patient medications and                            allergies were reviewed. The patient's tolerance of                            previous anesthesia was also reviewed. The risks                            and benefits of the procedure and the sedation                            options and risks were discussed with the patient.                            All questions were answered, and informed consent                            was obtained. Prior Anticoagulants: The patient has                            taken no anticoagulant or antiplatelet agents. ASA                            Grade Assessment: II - A patient with mild systemic  disease. After reviewing the risks and benefits,                            the patient was deemed in satisfactory condition to                            undergo the procedure.                           After obtaining informed consent, the colonoscope                            was passed under direct vision. Throughout the                            procedure, the patient's blood pressure, pulse, and                             oxygen saturations were monitored continuously. The                            Olympus Scope SN: [id number] was introduced through                            the anus and advanced to the the terminal ileum,                            with identification of the appendiceal orifice and                            IC valve. The colonoscopy was performed without                            difficulty. The patient tolerated the procedure                            well. The quality of the bowel preparation was                            good. The terminal ileum, ileocecal valve,                            appendiceal orifice, and rectum were photographed. Scope In: 8:13:51 AM Scope Out: 8:27:20 AM Scope Withdrawal Time: 0 hours 10 minutes 38 seconds  Total Procedure Duration: 0 hours 13 minutes 29 seconds  Findings:                 The perianal and digital rectal examinations were                            normal.                           The terminal ileum appeared normal.  Repeat examination of right colon under NBI                            performed.                           Internal hemorrhoids were found.                           The exam was otherwise without abnormality on                            direct and retroflexion views. Complications:            No immediate complications. Estimated Blood Loss:     Estimated blood loss: none. Impression:               - The examined portion of the ileum was normal.                           - Internal hemorrhoids.                           - The examination was otherwise normal on direct                            and retroflexion views.                           - No specimens collected. Recommendation:           - Patient has a contact number available for                            emergencies. The signs and symptoms of potential                            delayed complications were discussed with  the                            patient. Return to normal activities tomorrow.                            Written discharge instructions were provided to the                            patient.                           - Resume previous diet.                           - Continue present medications.                           - Repeat colonoscopy in 5 years for screening  purposes and family history of CRC. Kathye Cipriani L. Legrand, MD 01/22/2024 8:33:33 AM This report has been signed electronically.

## 2024-01-22 NOTE — Patient Instructions (Signed)
Handout on hemorrhoids provided.  YOU HAD AN ENDOSCOPIC PROCEDURE TODAY AT THE [facility]:   Refer to the procedure report that was given to you for any specific questions about what was found during the examination.  If the procedure report does not answer your questions, please call your gastroenterologist to clarify.  If you requested that your care partner not be given the details of your procedure findings, then the procedure report has been included in a sealed envelope for you to review at your convenience later.  YOU SHOULD EXPECT: Some feelings of bloating in the abdomen. Passage of more gas than usual.  Walking can help get rid of the air that was put into your GI tract during the procedure and reduce the bloating. If you had a lower endoscopy (such as a colonoscopy or flexible sigmoidoscopy) you may notice spotting of blood in your stool or on the toilet paper. If you underwent a bowel prep for your procedure, you may not have a normal bowel movement for a few days.  Please Note:  You might notice some irritation and congestion in your nose or some drainage.  This is from the oxygen used during your procedure.  There is no need for concern and it should clear up in a day or so.  SYMPTOMS TO REPORT IMMEDIATELY:  Following lower endoscopy (colonoscopy or flexible sigmoidoscopy):  Excessive amounts of blood in the stool  Significant tenderness or worsening of abdominal pains  Swelling of the abdomen that is new, acute  Fever of 100F or higher   For urgent or emergent issues, a gastroenterologist can be reached at any hour by calling [phone]. Do not use MyChart messaging for urgent concerns.    DIET:  We do recommend a small meal at first, but then you may proceed to your regular diet.  Drink plenty of fluids but you should avoid alcoholic beverages for 24 hours.  ACTIVITY:  You should plan to take it easy for the rest of today and you should NOT DRIVE or use heavy  machinery until tomorrow (because of the sedation medicines used during the test).    FOLLOW UP: Our staff will call the number listed on your records the next business day following your procedure.  We will call around 7:15- 8:00 am to check on you and address any questions or concerns that you may have regarding the information given to you following your procedure. If we do not reach you, we will leave a message.     If any biopsies were taken you will be contacted by phone or by letter within the next 1-3 weeks.  Please call us at [phone] if you have not heard about the biopsies in 3 weeks.    SIGNATURES/CONFIDENTIALITY: You and/or your care partner have signed paperwork which will be entered into your electronic medical record.  These signatures attest to the fact that that the information above on your After Visit Summary has been reviewed and is understood.  Full responsibility of the confidentiality of this discharge information lies with you and/or your care-partner.  

## 2024-01-22 NOTE — Progress Notes (Signed)
 Report given to PACU, vss

## 2024-01-23 ENCOUNTER — Telehealth: Payer: Self-pay

## 2024-01-23 NOTE — Telephone Encounter (Signed)
 Follow up call to pt, lm for pt to call if having any difficulty with normal activities or eating and drinking.  Also to call if any other questions or concerns.
